# Patient Record
Sex: Male | Born: 1970 | Race: Black or African American | Hispanic: No | Marital: Married | Smoking: Never smoker
Health system: Southern US, Community
[De-identification: ages and names within clinical notes are randomized; demographics above are authoritative.]

## PROBLEM LIST (undated history)

## (undated) DIAGNOSIS — M5020 Other cervical disc displacement, unspecified cervical region: Secondary | ICD-10-CM

## (undated) DIAGNOSIS — M549 Dorsalgia, unspecified: Secondary | ICD-10-CM

## (undated) DIAGNOSIS — E119 Type 2 diabetes mellitus without complications: Secondary | ICD-10-CM

## (undated) DIAGNOSIS — F32A Depression, unspecified: Secondary | ICD-10-CM

---

## 1998-02-21 HISTORY — PX: KNEE SURGERY: SHX244

## 1999-01-01 ENCOUNTER — Encounter: Payer: Self-pay | Admitting: Emergency Medicine

## 1999-01-01 ENCOUNTER — Emergency Department (HOSPITAL_COMMUNITY): Admission: EM | Admit: 1999-01-01 | Discharge: 1999-01-01 | Payer: Self-pay | Admitting: Emergency Medicine

## 1999-01-18 ENCOUNTER — Emergency Department (HOSPITAL_COMMUNITY): Admission: EM | Admit: 1999-01-18 | Discharge: 1999-01-18 | Payer: Self-pay | Admitting: Emergency Medicine

## 1999-02-08 ENCOUNTER — Emergency Department (HOSPITAL_COMMUNITY): Admission: EM | Admit: 1999-02-08 | Discharge: 1999-02-08 | Payer: Self-pay | Admitting: Emergency Medicine

## 1999-02-10 ENCOUNTER — Emergency Department (HOSPITAL_COMMUNITY): Admission: EM | Admit: 1999-02-10 | Discharge: 1999-02-10 | Payer: Self-pay

## 1999-06-01 ENCOUNTER — Emergency Department (HOSPITAL_COMMUNITY): Admission: EM | Admit: 1999-06-01 | Discharge: 1999-06-01 | Payer: Self-pay | Admitting: Emergency Medicine

## 2000-03-21 ENCOUNTER — Emergency Department (HOSPITAL_COMMUNITY): Admission: EM | Admit: 2000-03-21 | Discharge: 2000-03-21 | Payer: Self-pay | Admitting: Emergency Medicine

## 2001-01-13 ENCOUNTER — Encounter: Payer: Self-pay | Admitting: Emergency Medicine

## 2001-01-13 ENCOUNTER — Emergency Department (HOSPITAL_COMMUNITY): Admission: EM | Admit: 2001-01-13 | Discharge: 2001-01-13 | Payer: Self-pay | Admitting: Emergency Medicine

## 2001-01-17 ENCOUNTER — Encounter: Payer: Self-pay | Admitting: Emergency Medicine

## 2001-01-17 ENCOUNTER — Emergency Department (HOSPITAL_COMMUNITY): Admission: EM | Admit: 2001-01-17 | Discharge: 2001-01-17 | Payer: Self-pay | Admitting: Emergency Medicine

## 2003-03-30 ENCOUNTER — Emergency Department (HOSPITAL_COMMUNITY): Admission: EM | Admit: 2003-03-30 | Discharge: 2003-03-31 | Payer: Self-pay | Admitting: Emergency Medicine

## 2003-06-27 ENCOUNTER — Emergency Department (HOSPITAL_COMMUNITY): Admission: EM | Admit: 2003-06-27 | Discharge: 2003-06-27 | Payer: Self-pay | Admitting: Emergency Medicine

## 2003-12-30 ENCOUNTER — Emergency Department (HOSPITAL_COMMUNITY): Admission: EM | Admit: 2003-12-30 | Discharge: 2003-12-30 | Payer: Self-pay | Admitting: Emergency Medicine

## 2004-03-22 ENCOUNTER — Inpatient Hospital Stay (HOSPITAL_COMMUNITY): Admission: EM | Admit: 2004-03-22 | Discharge: 2004-03-26 | Payer: Self-pay | Admitting: Emergency Medicine

## 2006-06-01 IMAGING — CR DG CHEST 2V
2 series · 2 of 2 positions shown · non-contrast
Comparison: 2 view chest x-ray 06/27/2003.

CLINICAL DATA: Cough, chest pain, shortness of breath.

CHEST - 2 VIEW  03/22/2004:

[view not recorded (1 of 2)]
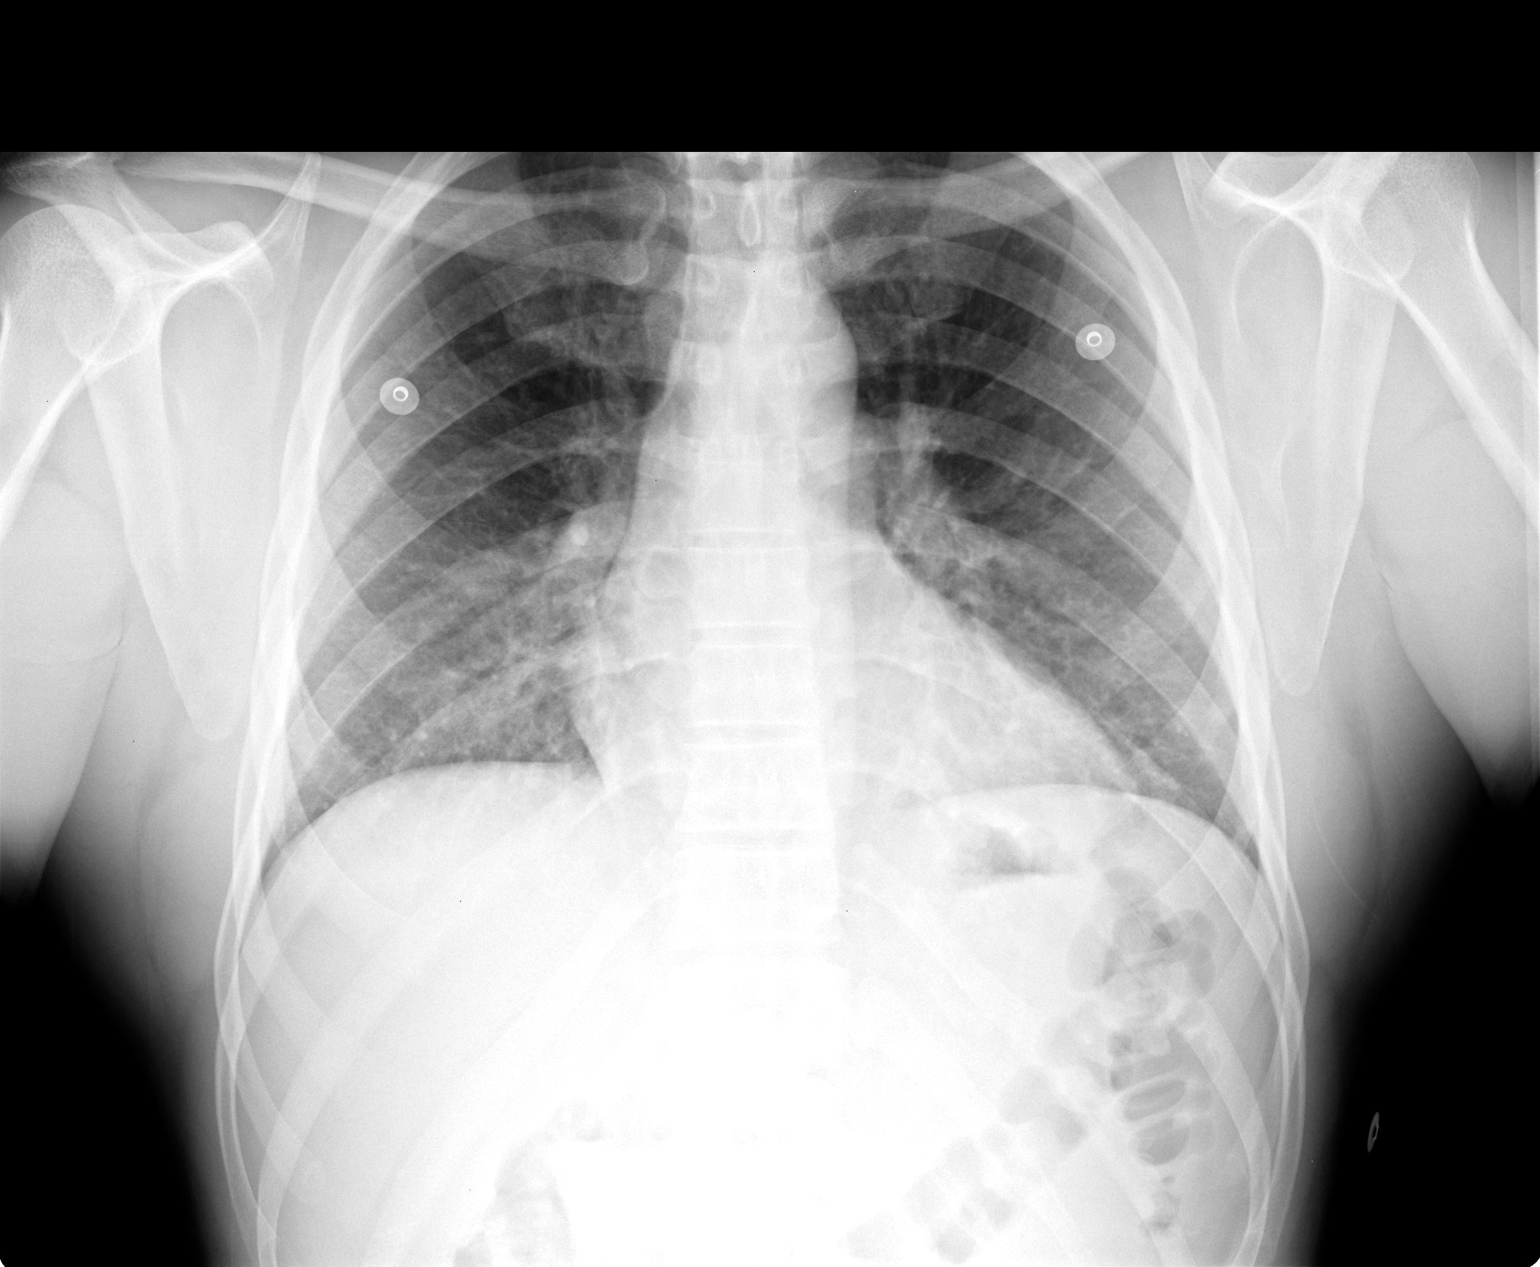

[view not recorded (2 of 2)]
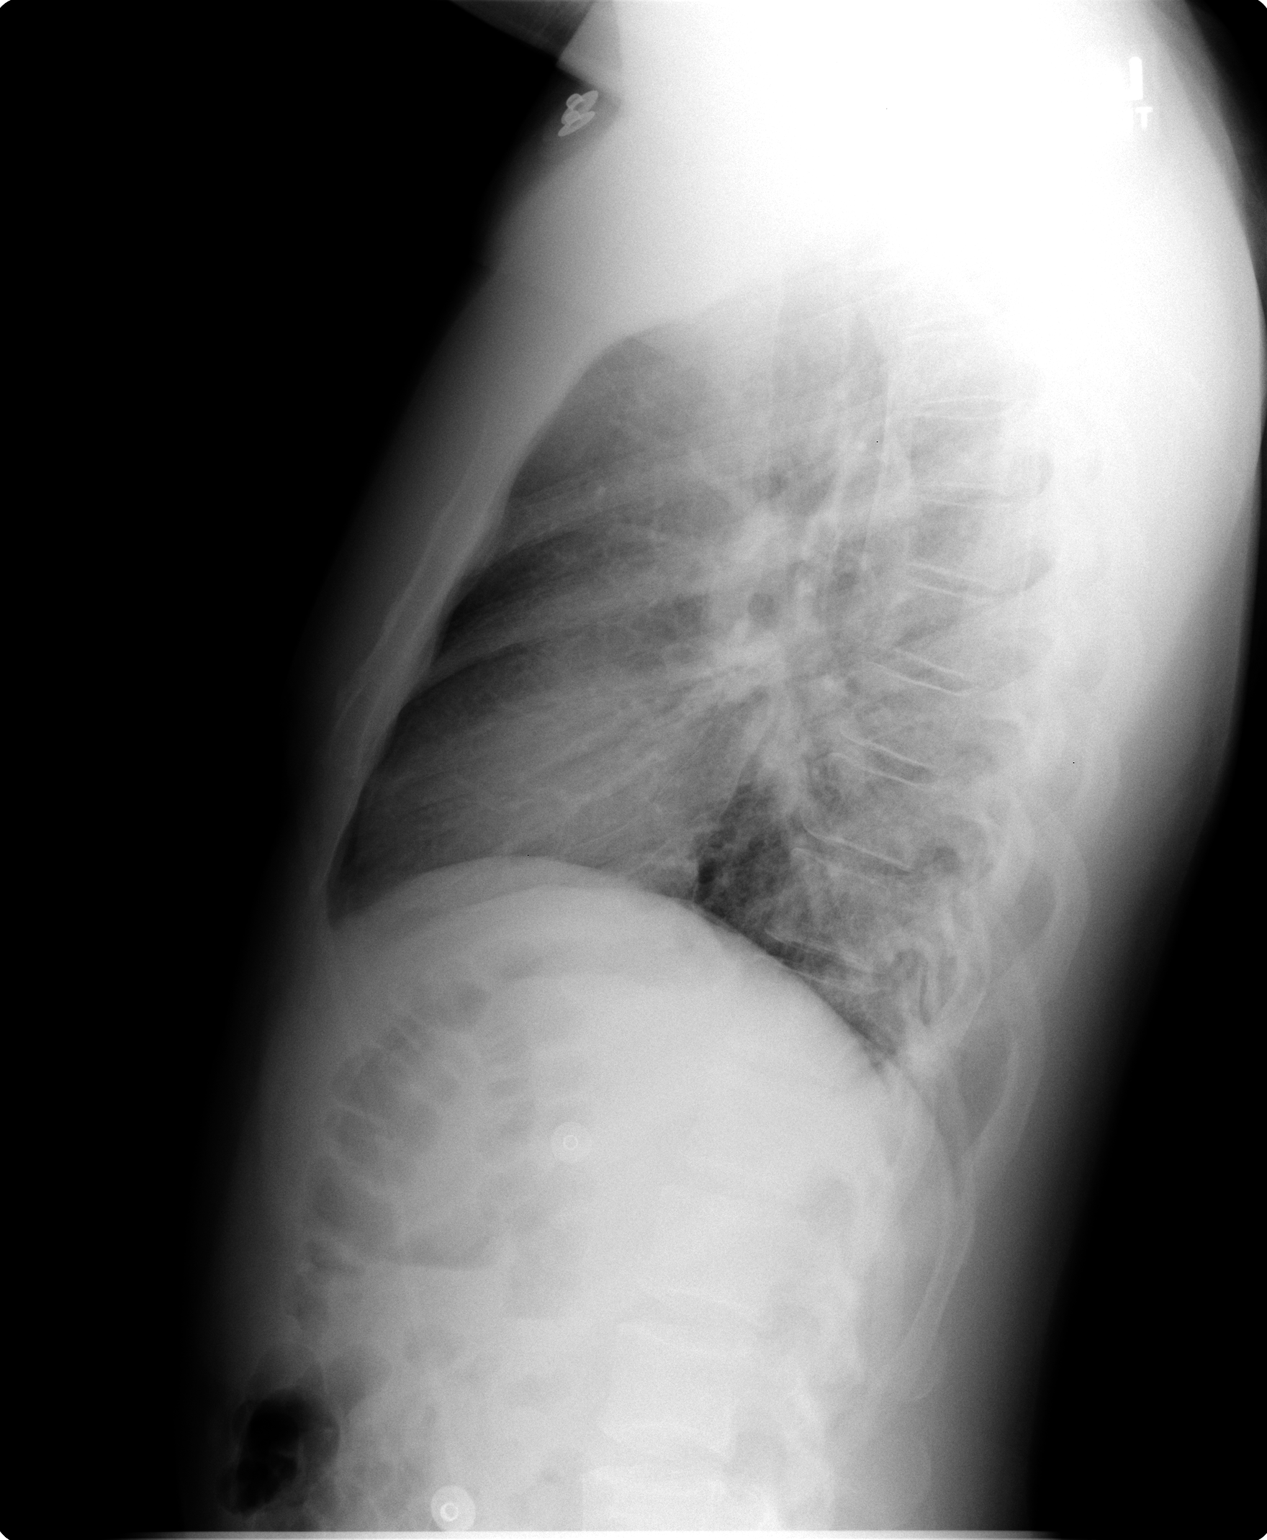

[2 of 2 positions shown; findings below may reference images not displayed]

FINDINGS: The cardiomediastinal silhouette is unremarkable. Patchy opacities
are present in the lower lobes bilaterally. The lungs appear clear otherwise.
There are no pleural effusions. The visualized bony thorax appears intact.
IMPRESSION: Bilateral lower lobe pneumonia.

## 2010-05-24 ENCOUNTER — Emergency Department (HOSPITAL_COMMUNITY)
Admission: EM | Admit: 2010-05-24 | Discharge: 2010-05-24 | Disposition: A | Discharge status: Home / Self Care | Payer: Self-pay | Attending: Emergency Medicine | Admitting: Emergency Medicine

## 2010-05-24 DIAGNOSIS — K0381 Cracked tooth: Secondary | ICD-10-CM | POA: Insufficient documentation

## 2010-05-24 DIAGNOSIS — K029 Dental caries, unspecified: Secondary | ICD-10-CM | POA: Insufficient documentation

## 2010-05-24 DIAGNOSIS — K089 Disorder of teeth and supporting structures, unspecified: Secondary | ICD-10-CM | POA: Insufficient documentation

## 2016-08-10 ENCOUNTER — Encounter (HOSPITAL_COMMUNITY): Payer: Self-pay | Admitting: Emergency Medicine

## 2016-08-10 ENCOUNTER — Emergency Department (HOSPITAL_COMMUNITY)
Admission: EM | Admit: 2016-08-10 | Discharge: 2016-08-10 | Disposition: A | Discharge status: Home / Self Care | Payer: Self-pay | Attending: Emergency Medicine | Admitting: Emergency Medicine

## 2016-08-10 DIAGNOSIS — B9789 Other viral agents as the cause of diseases classified elsewhere: Secondary | ICD-10-CM

## 2016-08-10 DIAGNOSIS — J069 Acute upper respiratory infection, unspecified: Secondary | ICD-10-CM

## 2016-08-10 DIAGNOSIS — E119 Type 2 diabetes mellitus without complications: Secondary | ICD-10-CM | POA: Insufficient documentation

## 2016-08-10 DIAGNOSIS — J302 Other seasonal allergic rhinitis: Secondary | ICD-10-CM | POA: Insufficient documentation

## 2016-08-10 DIAGNOSIS — R05 Cough: Secondary | ICD-10-CM | POA: Insufficient documentation

## 2016-08-10 DIAGNOSIS — J3089 Other allergic rhinitis: Secondary | ICD-10-CM

## 2016-08-10 HISTORY — DX: Type 2 diabetes mellitus without complications: E11.9

## 2016-08-10 HISTORY — DX: Dorsalgia, unspecified: M54.9

## 2016-08-10 HISTORY — DX: Other cervical disc displacement, unspecified cervical region: M50.20

## 2016-08-10 LAB — RAPID STREP SCREEN (MED CTR MEBANE ONLY): Streptococcus, Group A Screen (Direct): NEGATIVE

## 2016-08-10 MED ORDER — ALBUTEROL SULFATE HFA 108 (90 BASE) MCG/ACT IN AERS
2.0000 | INHALATION_SPRAY | Freq: Once | RESPIRATORY_TRACT | Status: AC
  Administered 2016-08-10: 2 via RESPIRATORY_TRACT
  Filled 2016-08-10: qty 6.7

## 2016-08-10 MED ORDER — NAPROXEN 250 MG PO TABS: 250.0000 mg | ORAL_TABLET | Freq: Two times a day (BID) | ORAL | 0 refills | Status: DC

## 2016-08-10 MED ORDER — BENZONATATE 100 MG PO CAPS: 100.0000 mg | ORAL_CAPSULE | Freq: Three times a day (TID) | ORAL | 0 refills | Status: DC | PRN

## 2016-08-10 MED ORDER — CETIRIZINE HCL 10 MG PO TABS: 10.0000 mg | ORAL_TABLET | Freq: Every day | ORAL | 1 refills | Status: DC

## 2016-08-10 MED ORDER — FLUTICASONE PROPIONATE 50 MCG/ACT NA SUSP: 2.0000 | Freq: Every day | NASAL | 0 refills | Status: DC

## 2016-08-10 NOTE — ED Provider Notes (Signed)
[facility]-EMERGENCY DEPT Provider Note   CSN: [id number] Arrival date & time: 08/10/16  2016     History   Chief Complaint Chief Complaint  Patient presents with  . Sore Throat  . Cough    HPI Brandon Boyer is a 46 y.o. male.  Brandon Boyer is a 46 y.o. Male who presents to the emergency department complaining of one day of sneezing, nasal congestion, postnasal drip, sore throat and coughing. He reports he thinks he became sick from a coworker. He denies any fevers. No treatments attempted prior to arrival. He also reports he feels some chest tightness, denies wheezing. He denies fevers, chest pain, shortness of breath, abdominal pain, rashes or wheezing.   The history is provided by the patient and medical records. No language interpreter was used.  Sore Throat  Pertinent negatives include no chest pain, no abdominal pain, no headaches and no shortness of breath.  Cough  Associated symptoms include rhinorrhea and sore throat. Pertinent negatives include no chest pain, no chills, no headaches, no myalgias, no shortness of breath and no wheezing.    Past Medical History:  Diagnosis Date  . Back pain   . Diabetes mellitus without complication (HCC)    Type II  . Herniated Disc     There are no active problems to display for this patient.   Past Surgical History:  Procedure Laterality Date  . KNEE SURGERY Right 2000       Home Medications    Prior to Admission medications   Medication Sig Start Date End Date Taking? Authorizing Provider  benzonatate (TESSALON) 100 MG capsule Take 1 capsule (100 mg total) by mouth 3 (three) times daily as needed for cough. 08/10/16   Everlene Farrier, PA-C  cetirizine (ZYRTEC ALLERGY) 10 MG tablet Take 1 tablet (10 mg total) by mouth daily. 08/10/16   Everlene Farrier, PA-C  fluticasone (FLONASE) 50 MCG/ACT nasal spray Place 2 sprays into both nostrils daily. 08/10/16   Everlene Farrier, PA-C  naproxen (NAPROSYN) 250 MG tablet Take 1  tablet (250 mg total) by mouth 2 (two) times daily with a meal. 08/10/16   Everlene Farrier, PA-C    Family History No family history on file.  Social History Social History  Substance Use Topics  . Smoking status: Never Smoker  . Smokeless tobacco: Never Used  . Alcohol use No     Allergies   Patient has no known allergies.   Review of Systems Review of Systems  Constitutional: Negative for chills and fever.  HENT: Positive for congestion, postnasal drip, rhinorrhea, sneezing and sore throat. Negative for ear discharge, trouble swallowing and voice change.   Eyes: Negative for visual disturbance.  Respiratory: Positive for cough and chest tightness. Negative for shortness of breath and wheezing.   Cardiovascular: Negative for chest pain.  Gastrointestinal: Negative for abdominal pain, nausea and vomiting.  Musculoskeletal: Negative for myalgias and neck pain.  Skin: Negative for rash.  Neurological: Negative for light-headedness and headaches.     Physical Exam Updated Vital Signs BP (!) 143/80 (BP Location: Left Arm)   Pulse 88   Temp 98.9 F (37.2 C) (Oral)   Resp 18   Ht 5\' 6"  (1.676 m)   Wt 83.9 kg (185 lb)   SpO2 98%   BMI 29.86 kg/m   Physical Exam  Constitutional: He appears well-developed and well-nourished. No distress.  Nontoxic appearing.  HENT:  Head: Normocephalic and atraumatic.  Right Ear: External ear normal.  Left Ear: External ear normal.  Mouth/Throat: Oropharynx is clear and moist. No oropharyngeal exudate.  Mild clear middle ear effusion noted bilaterally. No TM erythema or loss of landmarks. Boggy nasal turbinates and rhinorrhea present bilaterally. Throat is clear.  Eyes: Conjunctivae are normal. Pupils are equal, round, and reactive to light. Right eye exhibits no discharge. Left eye exhibits no discharge.  Neck: Normal range of motion. Neck supple. No JVD present. No tracheal deviation present.  Cardiovascular: Normal rate, regular  rhythm, normal heart sounds and intact distal pulses.   Pulmonary/Chest: Effort normal and breath sounds normal. No stridor. No respiratory distress. He has no wheezes. He has no rales.  Lungs are clear to ascultation bilaterally. Symmetric chest expansion bilaterally. No increased work of breathing. No rales or rhonchi.    Abdominal: Soft. There is no tenderness.  Musculoskeletal: He exhibits no edema.  Lymphadenopathy:    He has no cervical adenopathy.  Neurological: He is alert. Coordination normal.  Skin: Skin is warm and dry. No rash noted. He is not diaphoretic. No erythema. No pallor.  Psychiatric: He has a normal mood and affect. His behavior is normal.  Nursing note and vitals reviewed.    ED Treatments / Results  Labs (all labs ordered are listed, but only abnormal results are displayed) Labs Reviewed  RAPID STRE[facility]CREEN ([facility])  CULTURE, GROUP A STREP [facility])    EKG  EKG Interpretation None       Radiology No results found.  Procedures Procedures (including critical care time)  Medications Ordered in ED Medications  albuterol (PROVENTIL HFA;VENTOLIN HFA) 108 (90 Base) MCG/ACT inhaler 2 puff (not administered)     Initial Impression / Assessment and Plan / ED Course  I have reviewed the triage vital signs and the nursing notes.  Pertinent labs & imaging results that were available during my care of the patient were reviewed by me and considered in my medical decision making (see chart for details).    This  is a 46 y.o. Male who presents to the emergency department complaining of one day of sneezing, nasal congestion, postnasal drip, sore throat and coughing. He reports he thinks he became sick from a coworker. He denies any fevers. No treatments attempted prior to arrival. He also reports he feels some chest tightness, denies wheezing. He is not a smoker.  On exam the patient is afebrile nontoxic appearing. His lungs are clear to auscultation  bilaterally. No wheezing. Patient does complain of some chest tightness, however no wheezing noted on exam. Will provide with albuterol inhaler due to his subjective chest tightness. He has evidence of postnasal drip, rhinorrhea and clear middle ear effusion bilaterally. Patient has upper respiratory infection causing cough. Will discharge with Flonase, Zyrtec, Tessalon Perles and naproxen. I discussed strict and specific return precautions. I advised the patient to follow-up with their primary care provider this week. I advised the patient to return to the emergency department with new or worsening symptoms or new concerns. The patient verbalized understanding and agreement with plan.      Final Clinical Impressions(s) / ED Diagnoses   Final diagnoses:  Viral URI with cough  Seasonal allergic rhinitis due to other allergic trigger    New Prescriptions New Prescriptions   BENZONATATE (TESSALON) 100 MG CAPSULE    Take 1 capsule (100 mg total) by mouth 3 (three) times daily as needed for cough.   CETIRIZINE (ZYRTEC ALLERGY) 10 MG TABLET    Take 1 tablet (10 mg total) by mouth daily.   FLUTICASONE (  FLONASE) 50 MCG/ACT NASAL SPRAY    Place 2 sprays into both nostrils daily.   NAPROXEN (NAPROSYN) 250 MG TABLET    Take 1 tablet (250 mg total) by mouth 2 (two) times daily with a meal.     Everlene Farrier, PA-C 08/10/16 2346    Dione Booze, MD 08/11/16 [phone]

## 2016-08-10 NOTE — ED Triage Notes (Signed)
Pt comes in with complaints of sore throat and congested cough that he thinks he got from a co-worker.  States his back and chest have been hurting from coughing. States he is not sure if he has had a fever or not.  Afebrile on assessment.  Airway intact.  In no acute distress.

## 2016-08-10 NOTE — ED Notes (Signed)
Pt ambulatory and independent at discharge.  Verbalized understanding of discharge instructions 

## 2016-08-13 LAB — CULTURE, GROUP A STREP (THRC)

## 2017-08-28 ENCOUNTER — Emergency Department (HOSPITAL_COMMUNITY): Payer: 59

## 2017-08-28 ENCOUNTER — Encounter (HOSPITAL_COMMUNITY): Payer: Self-pay

## 2017-08-28 ENCOUNTER — Emergency Department (HOSPITAL_COMMUNITY)
Admission: EM | Admit: 2017-08-28 | Discharge: 2017-08-28 | Disposition: A | Discharge status: Home / Self Care | Payer: 59 | Attending: Emergency Medicine | Admitting: Emergency Medicine

## 2017-08-28 ENCOUNTER — Other Ambulatory Visit: Payer: Self-pay

## 2017-08-28 DIAGNOSIS — M7552 Bursitis of left shoulder: Secondary | ICD-10-CM | POA: Insufficient documentation

## 2017-08-28 DIAGNOSIS — Z7984 Long term (current) use of oral hypoglycemic drugs: Secondary | ICD-10-CM | POA: Insufficient documentation

## 2017-08-28 DIAGNOSIS — M25512 Pain in left shoulder: Secondary | ICD-10-CM | POA: Diagnosis present

## 2017-08-28 DIAGNOSIS — R0602 Shortness of breath: Secondary | ICD-10-CM | POA: Insufficient documentation

## 2017-08-28 DIAGNOSIS — R079 Chest pain, unspecified: Secondary | ICD-10-CM | POA: Insufficient documentation

## 2017-08-28 DIAGNOSIS — E119 Type 2 diabetes mellitus without complications: Secondary | ICD-10-CM | POA: Diagnosis not present

## 2017-08-28 DIAGNOSIS — Z79899 Other long term (current) drug therapy: Secondary | ICD-10-CM | POA: Insufficient documentation

## 2017-08-28 LAB — CBC
HCT: 50.7 % (ref 39.0–52.0)
Hemoglobin: 17.2 g/dL — ABNORMAL HIGH (ref 13.0–17.0)
MCH: 30.6 pg (ref 26.0–34.0)
MCHC: 33.9 g/dL (ref 30.0–36.0)
MCV: 90.2 fL (ref 78.0–100.0)
Platelets: 204 10*3/uL (ref 150–400)
RBC: 5.62 MIL/uL (ref 4.22–5.81)
RDW: 12.8 % (ref 11.5–15.5)
WBC: 4.6 10*3/uL (ref 4.0–10.5)

## 2017-08-28 LAB — BASIC METABOLIC PANEL
Anion gap: 8 (ref 5–15)
BUN: 14 mg/dL (ref 6–20)
CO2: 27 mmol/L (ref 22–32)
Calcium: 9.9 mg/dL (ref 8.9–10.3)
Chloride: 101 mmol/L (ref 98–111)
Creatinine, Ser: 1.11 mg/dL (ref 0.61–1.24)
GFR calc Af Amer: >60 mL/min (ref >60)
GFR calc non Af Amer: >60 mL/min (ref >60)
Glucose, Bld: 288 mg/dL — ABNORMAL HIGH (ref 70–99)
Potassium: 4.1 mmol/L (ref 3.5–5.1)
Sodium: 136 mmol/L (ref 135–145)

## 2017-08-28 LAB — I-STAT TROPONIN, ED: Troponin i, poc: 0.01 ng/mL (ref 0.00–0.08)

## 2017-08-28 MED ORDER — DEXAMETHASONE 4 MG PO TABS: 4.0000 mg | ORAL_TABLET | Freq: Two times a day (BID) | ORAL | 0 refills | Status: DC

## 2017-08-28 NOTE — ED Triage Notes (Signed)
Patient c/o left arm pain x 4 weeks Patient states when he woke this AM he had SOB and mid chest pain.

## 2017-08-28 NOTE — ED Notes (Signed)
Discharge instructions reviewed with patient. Patient verbalizes understanding. VSS.   

## 2017-09-01 NOTE — ED Provider Notes (Signed)
[facility]-EMERGENCY DEPT Provider Note   CSN: [id number] Arrival date & time: 08/28/17  1121     History   Chief Complaint Chief Complaint  Patient presents with  . Arm Pain  . Chest Pain  . Shortness of Breath    HPI Brandon Boyer is a 47 y.o. male.  HPI   46yM with L shoulder pain for months. Worse this morning when getting out of bed and also felt SOB. His breath felt beter as he got up and walked around. After about 5 minutes it had completely resolved and has nor recurred. No cough. No fever. No unusual leg pain or swelling. [facility]. Pain worse with certain movements particularly when raising over the head.   Past Medical History:  Diagnosis Date  . Back pain   . Diabetes mellitus without complication (HCC)    Type II  . Herniated Disc     There are no active problems to display for this patient.   Past Surgical History:  Procedure Laterality Date  . KNEE SURGERY Right 2000        Home Medications    Prior to Admission medications   Medication Sig Start Date End Date Taking? Authorizing Provider  diclofenac (VOLTAREN) 75 MG EC tablet Take 75 mg by mouth 2 (two) times daily.   Yes [provider]  HYDROcodone-acetaminophen (NORCO/VICODIN) 5-325 MG tablet Take 1 tablet by mouth every 6 (six) hours as needed for moderate pain.   Yes [provider]  meloxicam (MOBIC) 15 MG tablet Take 15 mg by mouth 2 (two) times daily as needed for pain.   Yes [provider]  metFORMIN (GLUCOPHAGE) 500 MG tablet Take 500 mg by mouth 2 (two) times daily with a meal.    Yes [provider]  methocarbamol (ROBAXIN) 500 MG tablet Take 500 mg by mouth 4 (four) times daily.   Yes [provider]  predniSONE (DELTASONE) 5 MG tablet Take 5 mg by mouth 3 (three) times daily.   Yes [provider]  dexamethasone (DECADRON) 4 MG tablet Take 1 tablet (4 mg total) by  mouth 2 (two) times daily with a meal. 08/28/17   Raeford RazorKohut, Kareen Hitsman, MD    Family History Family History  Problem Relation Age of Onset  . Sickle cell anemia Mother   . Heart failure Father     Social History Social History   Tobacco Use  . Smoking status: Never Smoker  . Smokeless tobacco: Never Used  Substance Use Topics  . Alcohol use: No  . Drug use: No     Allergies   Pork-derived products   Review of Systems Review of Systems  All systems reviewed and negative, other than as noted in HPI.  Physical Exam Updated Vital Signs BP 116/76 (BP Location: Right Arm)   Pulse 73   Temp 98.1 F (36.7 C) (Oral)   Resp 18   Ht 5\' 6"  (1.676 m)   Wt 79.4 kg (175 lb)   SpO2 100%   BMI 28.25 kg/m   Physical Exam  Constitutional: He appears well-developed and well-nourished. No distress.  HENT:  Head: Normocephalic and atraumatic.  Eyes: Conjunctivae are normal. Right eye exhibits no discharge. Left eye exhibits no discharge.  Neck: Neck supple.  Cardiovascular: Normal rate, regular rhythm and normal heart sounds. Exam reveals no gallop and no friction rub.  No murmur heard. Pulmonary/Chest: Effort normal and breath sounds normal. No respiratory distress.  Abdominal: Soft. He exhibits no distension. There is no tenderness.  Musculoskeletal: He exhibits no edema or tenderness.  Point tenderness just below L acromion. + kennedy-hawkins test. NVI.   Neurological: He is alert.  Skin: Skin is warm and dry.  Psychiatric: He has a normal mood and affect. His behavior is normal. Thought content normal.  Nursing note and vitals reviewed.    ED Treatments / Results  Labs (all labs ordered are listed, but only abnormal results are displayed) Labs Reviewed  BASIC METABOLIC PANEL - Abnormal; Notable for the following components:      Result Value   Glucose, Bld 288 (*)    All other components within normal limits  CBC - Abnormal; Notable for the following components:    Hemoglobin 17.2 (*)    All other components within normal limits  I-STAT TROPONIN, ED    EKG EKG Interpretation  Date/Time:  Monday August 28 2017 11:27:41 EDT Ventricular Rate:  78 PR Interval:    QRS Duration: 89 QT Interval:  343 QTC Calculation: 391 R Axis:   73 Text Interpretation:  Sinus rhythm Nonspecific repol abnormality, inferior and precordial  leads Confirmed by Raeford Razor [phone]) on 08/28/2017 1:13:09 PM   Radiology No results found.  Procedures Procedures (including critical care time)  Medications Ordered in ED Medications - No data to display   Initial Impression / Assessment and Plan / ED Course  I have reviewed the triage vital signs and the nursing notes.  Pertinent labs & imaging results that were available during my care of the patient were reviewed by me and considered in my medical decision making (see chart for details).     46yM with L shoulder pain for months. Like impingement syndrome. PRN NSAIDs. Sling for comfort. Avoid activities that exacerbate. Ortho FU. I'm not sure of exact etiology of dyspnea. Improved with activity. Now symptom free in this regard. No particularly concerning associated features. Normal pulmonary exam.   Final Clinical Impressions(s) / ED Diagnoses   Final diagnoses:  Subacromial bursitis of left shoulder joint    ED Discharge Orders        Ordered    dexamethasone (DECADRON) 4 MG tablet  2 times daily with meals     08/28/17 1406       Raeford Razor, MD 09/01/17 [phone]

## 2019-01-31 ENCOUNTER — Emergency Department (HOSPITAL_COMMUNITY): Payer: 59

## 2019-01-31 ENCOUNTER — Emergency Department (HOSPITAL_COMMUNITY)
Admission: EM | Admit: 2019-01-31 | Discharge: 2019-01-31 | Disposition: A | Discharge status: Home / Self Care | Payer: 59 | Attending: Emergency Medicine | Admitting: Emergency Medicine

## 2019-01-31 ENCOUNTER — Other Ambulatory Visit: Payer: Self-pay

## 2019-01-31 DIAGNOSIS — E119 Type 2 diabetes mellitus without complications: Secondary | ICD-10-CM | POA: Diagnosis not present

## 2019-01-31 DIAGNOSIS — R5383 Other fatigue: Secondary | ICD-10-CM | POA: Insufficient documentation

## 2019-01-31 DIAGNOSIS — Z79899 Other long term (current) drug therapy: Secondary | ICD-10-CM | POA: Diagnosis not present

## 2019-01-31 DIAGNOSIS — M7918 Myalgia, other site: Secondary | ICD-10-CM | POA: Diagnosis not present

## 2019-01-31 DIAGNOSIS — U071 COVID-19: Secondary | ICD-10-CM | POA: Insufficient documentation

## 2019-01-31 DIAGNOSIS — R0981 Nasal congestion: Secondary | ICD-10-CM | POA: Diagnosis not present

## 2019-01-31 DIAGNOSIS — Z7984 Long term (current) use of oral hypoglycemic drugs: Secondary | ICD-10-CM | POA: Diagnosis not present

## 2019-01-31 DIAGNOSIS — R05 Cough: Secondary | ICD-10-CM | POA: Diagnosis present

## 2019-01-31 LAB — POC SARS CORONAVIRUS 2 AG -  ED: SARS Coronavirus 2 Ag: POSITIVE — AB

## 2019-01-31 MED ORDER — MELOXICAM 7.5 MG PO TABS
15.0000 mg | ORAL_TABLET | Freq: Once | ORAL | Status: AC
  Administered 2019-01-31: 09:00:00 15 mg via ORAL
  Filled 2019-01-31: qty 2

## 2019-01-31 NOTE — Discharge Instructions (Signed)
Please go to primary doctor to schedule virtual recheck for 2 3 days from now.  Recommend Tylenol and your previously prescribed meloxicam for muscle aches.  If you develop difficulty breathing, vomiting, chest pain or other new concerning symptom, recommend return to ER for reassessment.  Please follow isolation precautions as discussed.  Please see attached for CDC guidelines.

## 2019-01-31 NOTE — ED Triage Notes (Signed)
Pt endorses coughing, sneezing, bodyaches, chills x 5 days. Has NOT lost taste or smell. Last took tylenol at midnight last night. Denies shob or CP. VSS.

## 2019-01-31 NOTE — ED Provider Notes (Signed)
[facility] Provider Note   CSN: [id number] Arrival date & time: 01/31/19  [id number]     History Chief Complaint  Patient presents with  . Cough  . Nasal Congestion    Brandon Boyer is a 48 y.o. male.  Presents to ER with cough, congestion, body aches and chills for the last 5 days.  Symptoms started Saturday evening, noted generalized fatigue, woke up in the next morning with mild cough.  Also noted beginnings of myalgias.  States all the symptoms have steadily worsened, today cough is moderate, nonproductive, nonbloody.  Has not had any associated shortness of breath or chest pain.  No abdominal pain or nausea.  States his muscles hurt "all over", has had no intermittent headache, not worst headache of his life, not sudden onset.  States his boss at work was having symptoms earlier on Saturday but he is unsure if he tested positive for the coronavirus.  States his children have also developed symptoms for the last couple days similar to his, they are getting tested today for COVID-19.  Has been able to eat and drink.  No loss of taste or smell.  Patient reports only medical problem is diabetes, Metformin and Lantus, noted sugars running mildly higher than normal but only up to around 200.  HPI     Past Medical History:  Diagnosis Date  . Back pain   . Diabetes mellitus without complication (HCC)    Type II  . Herniated Disc     There are no problems to display for this patient.   Past Surgical History:  Procedure Laterality Date  . KNEE SURGERY Right 2000       Family History  Problem Relation Age of Onset  . Sickle cell anemia Mother   . Heart failure Father     Social History   Tobacco Use  . Smoking status: Never Smoker  . Smokeless tobacco: Never Used  Substance Use Topics  . Alcohol use: No  . Drug use: No    Home Medications Prior to Admission medications   Medication Sig Start Date End Date Taking? Authorizing  Provider  dexamethasone (DECADRON) 4 MG tablet Take 1 tablet (4 mg total) by mouth 2 (two) times daily with a meal. 08/28/17   Raeford RazorKohut, Stephen, MD  diclofenac (VOLTAREN) 75 MG EC tablet Take 75 mg by mouth 2 (two) times daily.    [provider]  HYDROcodone-acetaminophen (NORCO/VICODIN) 5-325 MG tablet Take 1 tablet by mouth every 6 (six) hours as needed for moderate pain.    [provider]  meloxicam (MOBIC) 15 MG tablet Take 15 mg by mouth 2 (two) times daily as needed for pain.    [provider]  metFORMIN (GLUCOPHAGE) 500 MG tablet Take 500 mg by mouth 2 (two) times daily with a meal.     [provider]  methocarbamol (ROBAXIN) 500 MG tablet Take 500 mg by mouth 4 (four) times daily.    [provider]  predniSONE (DELTASONE) 5 MG tablet Take 5 mg by mouth 3 (three) times daily.    [provider]    Allergies    Pork-derived products  Review of Systems   Review of Systems  Constitutional: Positive for chills and fatigue. Negative for fever.  HENT: Negative for ear pain and sore throat.   Eyes: Negative for pain and visual disturbance.  Respiratory: Positive for cough. Negative for shortness of breath.   Cardiovascular: Negative for chest pain and palpitations.  Gastrointestinal: Negative for abdominal pain and vomiting.  Genitourinary: Negative for dysuria and hematuria.  Musculoskeletal: Positive for myalgias. Negative for arthralgias and back pain.  Skin: Negative for color change and rash.  Neurological: Negative for seizures and syncope.  All other systems reviewed and are negative.   Physical Exam Updated Vital Signs BP 114/84   Pulse 88   Temp 98.5 F (36.9 C) (Oral)   Resp 16   SpO2 95%   Physical Exam Vitals and nursing note reviewed.  Constitutional:      Appearance: He is well-developed.  HENT:     Head: Normocephalic and atraumatic.     Mouth/Throat:     Mouth: Mucous membranes are moist.     Pharynx:  Oropharynx is clear.  Eyes:     Conjunctiva/sclera: Conjunctivae normal.  Cardiovascular:     Rate and Rhythm: Normal rate and regular rhythm.     Heart sounds: No murmur.  Pulmonary:     Effort: Pulmonary effort is normal. No respiratory distress.     Breath sounds: Normal breath sounds.  Abdominal:     Palpations: Abdomen is soft.     Tenderness: There is no abdominal tenderness.  Musculoskeletal:        General: No swelling or deformity.     Cervical back: Neck supple.     Right lower leg: No edema.     Left lower leg: No edema.  Skin:    General: Skin is warm and dry.  Neurological:     Mental Status: He is alert.     ED Results / Procedures / Treatments   Labs (all labs ordered are listed, but only abnormal results are displayed) Labs Reviewed  POC SARS CORONAVIRUS 2 AG -  ED    EKG None  Radiology No results found.  Procedures Procedures (including critical care time)  Medications Ordered in ED Medications - No data to display  ED Course  I have reviewed the triage vital signs and the nursing notes.  Pertinent labs & imaging results that were available during my care of the patient were reviewed by me and considered in my medical decision making (see chart for details).  Clinical Course as of Jan 30 902  Thu Jan 31, 2019  6283 SARS Coronavirus 2 Ag(!): POSITIVE [RD]  0901 Updated patient on results, reviewed return precautions, isolation precautions   [RD]    Clinical Course User Index [RD] Milagros Loll, MD   MDM Rules/Calculators/A&P                   48 year old male past medical history diabetes presents to ER with cough, congestion, myalgias.  On exam patient is noted to be very well-appearing, normal vital signs, no respiratory difficulty. Tolerating PO.  Suspect acute viral illness.  CXR without evidence for pneumonia.  Rapid Covid positive.  Reviewed isolation precautions, return precautions in detail with patient.  Recommended virtual  recheck with PCP.    After the discussed management above, the patient was determined to be safe for discharge.  The patient was in agreement with this plan and all questions regarding their care were answered.  ED return precautions were discussed and the patient will return to the ED with any significant worsening of condition.    Brandon Boyer was evaluated in Emergency Department on 01/31/2019 for the symptoms described in the history of present illness. He was evaluated in the context of the global COVID-19 pandemic, which necessitated consideration that the patient might  be at risk for infection with the SARS-CoV-2 virus that causes COVID-19. Institutional protocols and algorithms that pertain to the evaluation of patients at risk for COVID-19 are in a state of rapid change based on information released by regulatory bodies including the CDC and federal and state organizations. These policies and algorithms were followed during the patient's care in the ED.    Final Clinical Impression(s) / ED Diagnoses Final diagnoses:  [id number]    Rx / DC Orders ED Discharge Orders    None       Lucrezia Starch, MD 01/31/19 [phone]

## 2019-11-07 IMAGING — CR DG CHEST 2V
2 series · 2 of 2 positions shown · non-contrast
Comparison: Radiographs 03/24/2004 and 03/22/2004.

CLINICAL DATA: Shoulder pain for weeks ago. Chest pain and
shortness of breath.

EXAM:
CHEST - 2 VIEW

[w chest pa]
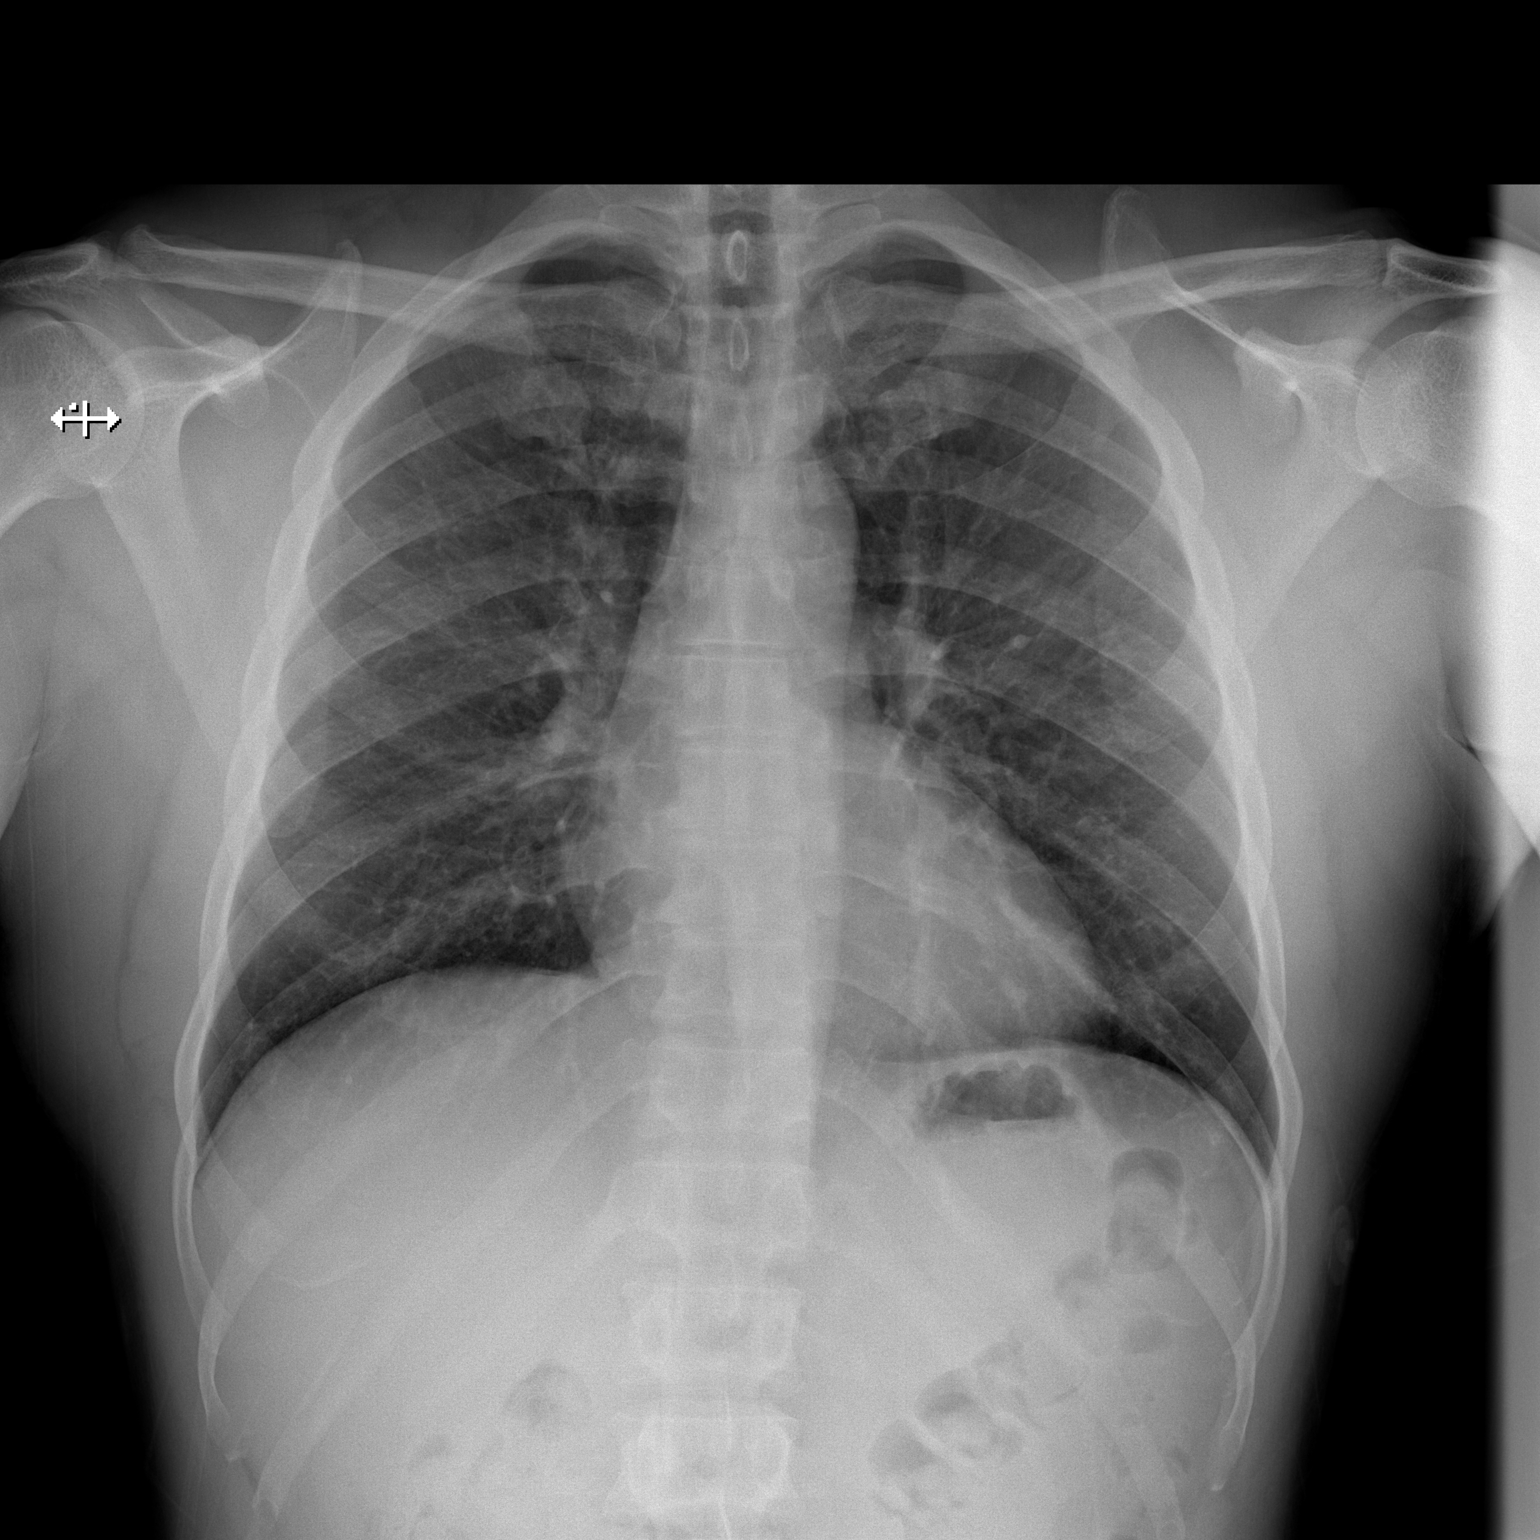

[w chest lat]
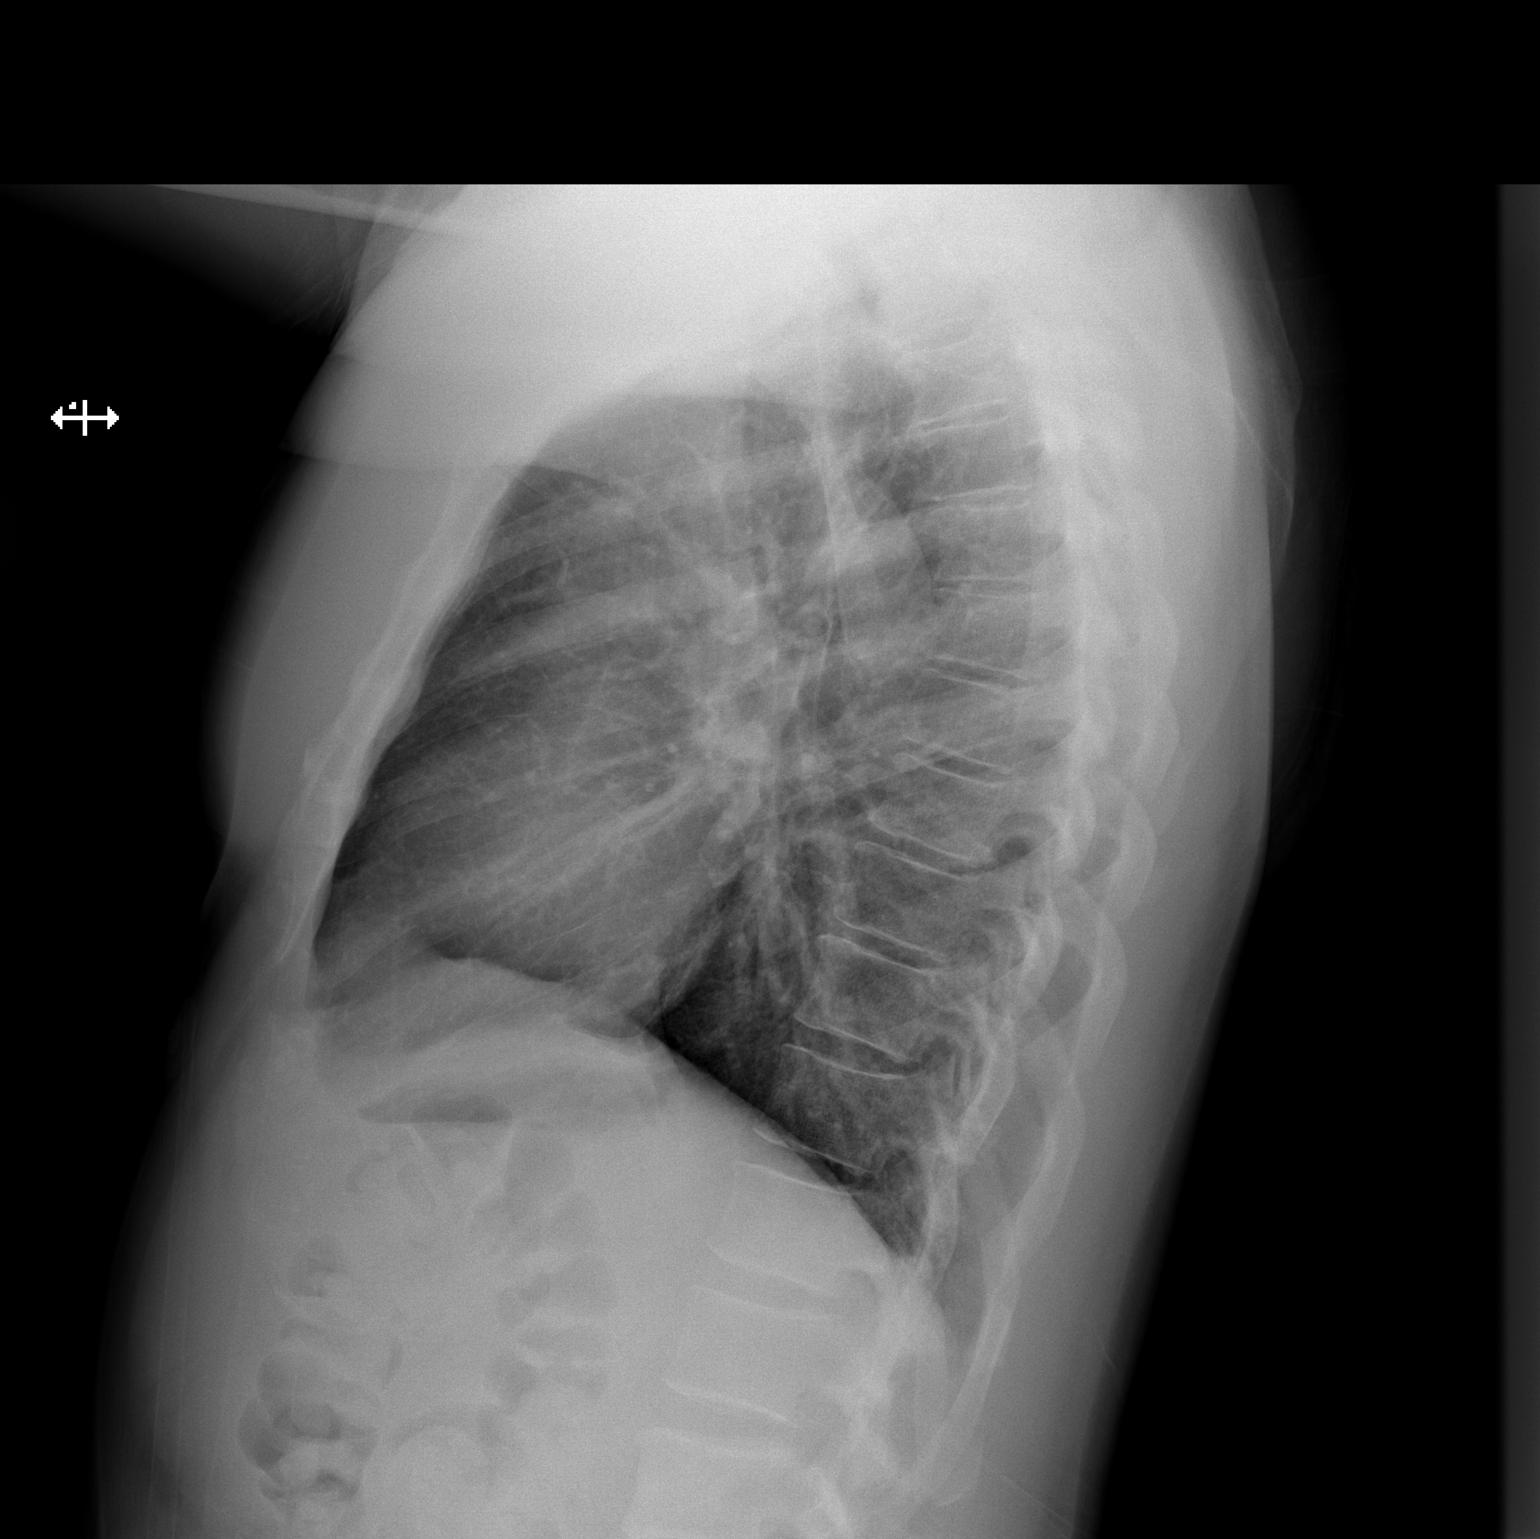

[2 of 2 positions shown; findings below may reference images not displayed]

FINDINGS: The heart size and mediastinal contours are normal. The lungs are
clear. There is no pleural effusion or pneumothorax. No acute
osseous findings are identified.
IMPRESSION: No active cardiopulmonary process.

## 2021-04-11 IMAGING — DX DG CHEST 1V PORT
1 series · 1 of 1 positions shown · non-contrast
Comparison: August 28, 2017

CLINICAL DATA: Cough and fever

EXAM:
PORTABLE CHEST 1 VIEW

[chest]
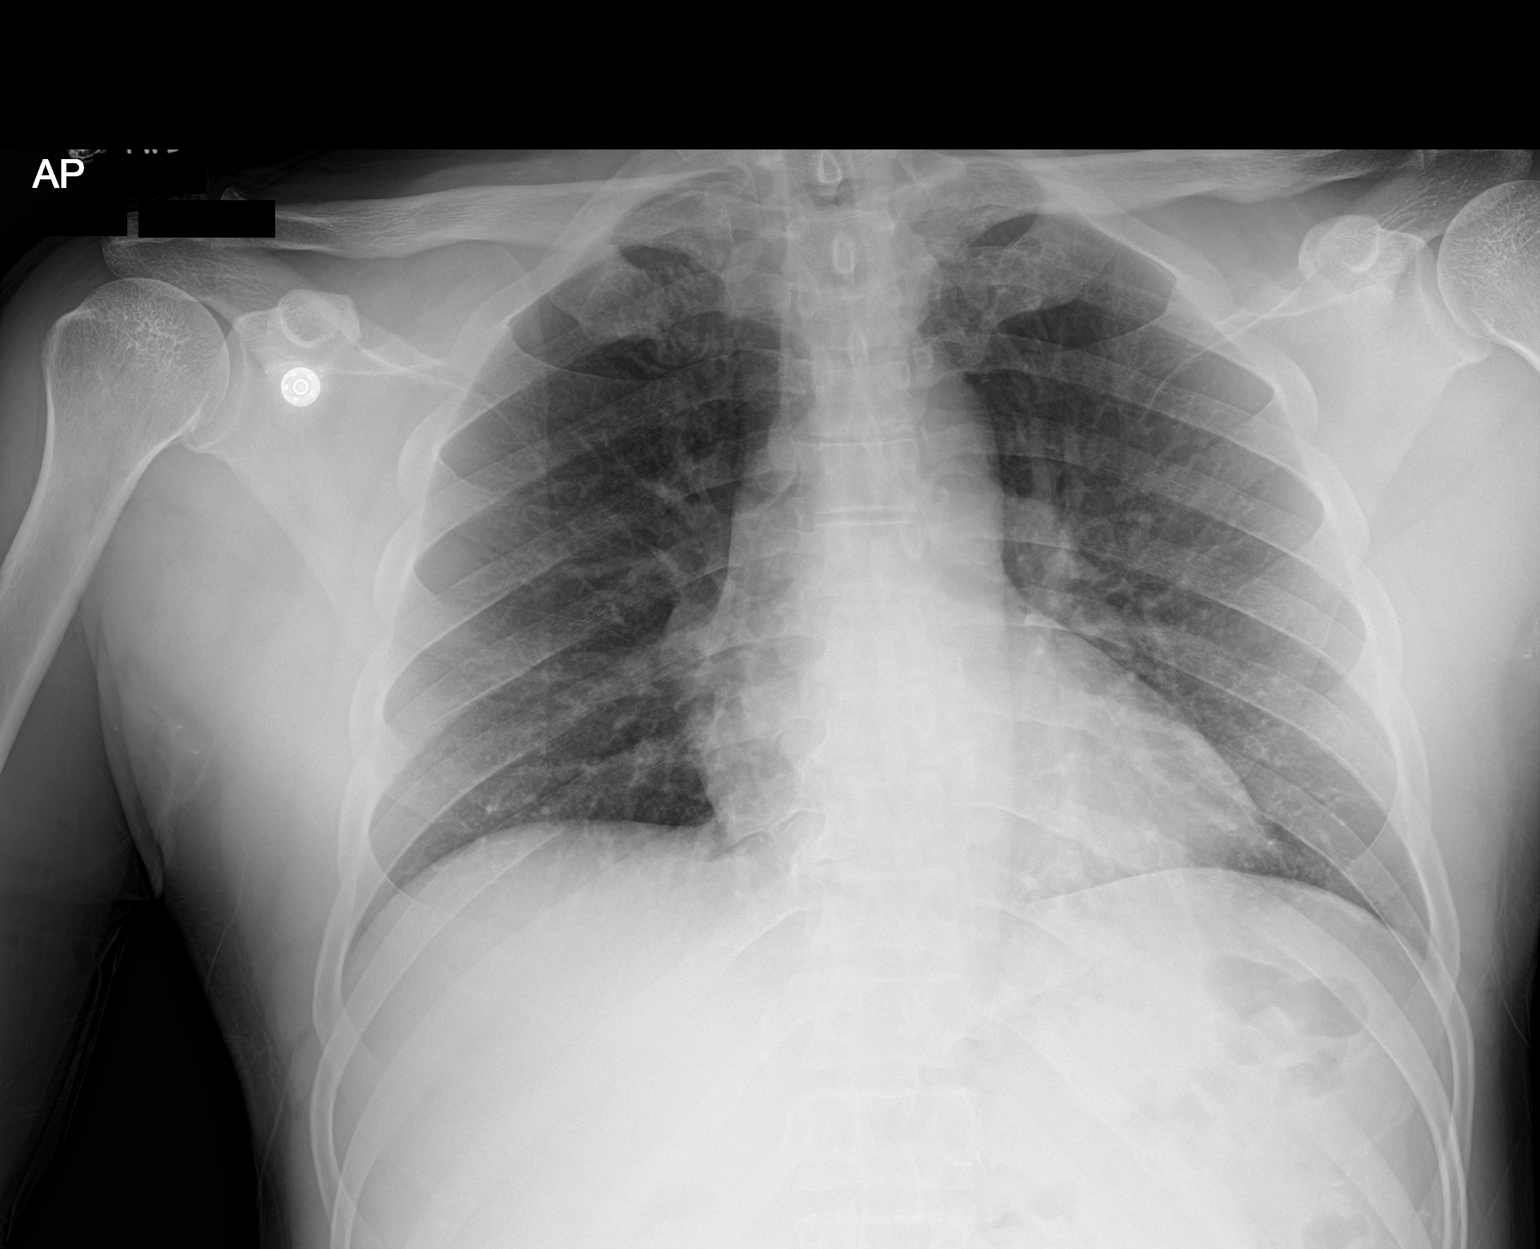

[1 of 1 positions shown; findings below may reference images not displayed]

FINDINGS: The heart size and mediastinal contours are within normal limits.
Both lungs are clear. The visualized skeletal structures are
unremarkable.
IMPRESSION: No active disease.

## 2021-04-15 ENCOUNTER — Other Ambulatory Visit: Payer: Self-pay

## 2021-04-15 ENCOUNTER — Emergency Department (HOSPITAL_COMMUNITY): Payer: Self-pay

## 2021-04-15 ENCOUNTER — Encounter (HOSPITAL_COMMUNITY): Payer: Self-pay | Admitting: Emergency Medicine

## 2021-04-15 ENCOUNTER — Emergency Department (HOSPITAL_COMMUNITY)
Admission: EM | Admit: 2021-04-15 | Discharge: 2021-04-16 | Disposition: A | Discharge status: Home / Self Care | Payer: Self-pay | Attending: Emergency Medicine | Admitting: Emergency Medicine

## 2021-04-15 DIAGNOSIS — R739 Hyperglycemia, unspecified: Secondary | ICD-10-CM

## 2021-04-15 DIAGNOSIS — R42 Dizziness and giddiness: Secondary | ICD-10-CM | POA: Insufficient documentation

## 2021-04-15 DIAGNOSIS — E1165 Type 2 diabetes mellitus with hyperglycemia: Secondary | ICD-10-CM | POA: Insufficient documentation

## 2021-04-15 DIAGNOSIS — R61 Generalized hyperhidrosis: Secondary | ICD-10-CM | POA: Insufficient documentation

## 2021-04-15 DIAGNOSIS — Z7984 Long term (current) use of oral hypoglycemic drugs: Secondary | ICD-10-CM | POA: Insufficient documentation

## 2021-04-15 DIAGNOSIS — Z20822 Contact with and (suspected) exposure to covid-19: Secondary | ICD-10-CM | POA: Insufficient documentation

## 2021-04-15 LAB — COMPREHENSIVE METABOLIC PANEL
ALT: 37 U/L (ref 0–44)
AST: 27 U/L (ref 15–41)
Albumin: 4.1 g/dL (ref 3.5–5.0)
Alkaline Phosphatase: 78 U/L (ref 38–126)
Anion gap: 7 (ref 5–15)
BUN: 12 mg/dL (ref 6–20)
CO2: 29 mmol/L (ref 22–32)
Calcium: 9.9 mg/dL (ref 8.9–10.3)
Chloride: 99 mmol/L (ref 98–111)
Creatinine, Ser: 0.87 mg/dL (ref 0.61–1.24)
GFR, Estimated: >60 mL/min (ref >60)
Glucose, Bld: 324 mg/dL — ABNORMAL HIGH (ref 70–99)
Potassium: 3.9 mmol/L (ref 3.5–5.1)
Sodium: 135 mmol/L (ref 135–145)
Total Bilirubin: 0.3 mg/dL (ref 0.3–1.2)
Total Protein: 7.4 g/dL (ref 6.5–8.1)

## 2021-04-15 LAB — CBC WITH DIFFERENTIAL/PLATELET
Abs Immature Granulocytes: 0.02 10*3/uL (ref 0.00–0.07)
Basophils Absolute: 0 10*3/uL (ref 0.0–0.1)
Basophils Relative: 0 %
Eosinophils Absolute: 0.1 10*3/uL (ref 0.0–0.5)
Eosinophils Relative: 1 %
HCT: 41.3 % (ref 39.0–52.0)
Hemoglobin: 14.1 g/dL (ref 13.0–17.0)
Immature Granulocytes: 0 %
Lymphocytes Relative: 26 %
Lymphs Abs: 2 10*3/uL (ref 0.7–4.0)
MCH: 30.3 pg (ref 26.0–34.0)
MCHC: 34.1 g/dL (ref 30.0–36.0)
MCV: 88.8 fL (ref 80.0–100.0)
Monocytes Absolute: 0.5 10*3/uL (ref 0.1–1.0)
Monocytes Relative: 6 %
Neutro Abs: 5.2 10*3/uL (ref 1.7–7.7)
Neutrophils Relative %: 67 %
Platelets: 190 10*3/uL (ref 150–400)
RBC: 4.65 MIL/uL (ref 4.22–5.81)
RDW: 12.1 % (ref 11.5–15.5)
WBC: 7.8 10*3/uL (ref 4.0–10.5)
nRBC: 0 % (ref 0.0–0.2)

## 2021-04-15 LAB — URINALYSIS, ROUTINE W REFLEX MICROSCOPIC
Bacteria, UA: NONE SEEN
Bilirubin Urine: NEGATIVE
Glucose, UA: >=500 mg/dL — AB
Hgb urine dipstick: NEGATIVE
Ketones, ur: 5 mg/dL — AB
Leukocytes,Ua: NEGATIVE
Nitrite: NEGATIVE
Protein, ur: NEGATIVE mg/dL
Specific Gravity, Urine: 1.025 (ref 1.005–1.030)
pH: 6 (ref 5.0–8.0)

## 2021-04-15 LAB — BETA-HYDROXYBUTYRIC ACID: Beta-Hydroxybutyric Acid: 0.16 mmol/L (ref 0.05–0.27)

## 2021-04-15 LAB — RESP PANEL BY RT-PCR (FLU A&B, COVID) ARPGX2
Influenza A by PCR: NEGATIVE
Influenza B by PCR: NEGATIVE
SARS Coronavirus 2 by RT PCR: NEGATIVE

## 2021-04-15 LAB — CBG MONITORING, ED
Glucose-Capillary: 320 mg/dL — ABNORMAL HIGH (ref 70–99)
Glucose-Capillary: 94 mg/dL (ref 70–99)

## 2021-04-15 LAB — TROPONIN I (HIGH SENSITIVITY)
Troponin I (High Sensitivity): 2 ng/L (ref <18)
Troponin I (High Sensitivity): 2 ng/L (ref <18)

## 2021-04-15 MED ORDER — SODIUM CHLORIDE 0.9 % IV BOLUS
1000.0000 mL | Freq: Once | INTRAVENOUS | Status: AC
  Administered 2021-04-15: 1000 mL via INTRAVENOUS

## 2021-04-15 NOTE — ED Triage Notes (Signed)
Pt reports being type 2 diabetic. Pt reports CBG being in 400s yesterday. Reports taking insulin as directed. Pt states today he began feeling like he has lightheaded, palpitations, and heavy breathing. CBG in triage 320.

## 2021-04-15 NOTE — ED Provider Notes (Signed)
[facility] DEPT Provider Note   CSN: [id number] Arrival date & time: 04/15/21  1438     History  Chief Complaint  Patient presents with   Hyperglycemia    Brandon Boyer is a 51 y.o. male.  51yo man presents with 1 day of feeling unwell. Yesterday he did not feel quite right and he checked his blood sugar, which was >400. Patient then took his medication (novolog 70/30) and his BG was improved to 230s. This morning he felt fine and his fasting BG was in the mid 200s. He then took his son to school and came home. He started sweating, feeling dizzy, shaky, dripping sweat and overall felt very ill. He denies chest pain, SOB, n/v/d, and abdominal pain. He sat down and rested and had 3 bowls of cheerio's and felt better. His daughter came over some time and they checked his blood glucose then which was stable in the mid 200s. He is a veteran who goes to the [place] and takes metformin 1000 mg BID as well as novolog 70/30, not on a statin.      Home Medications Prior to Admission medications   Medication Sig Start Date End Date Taking? Authorizing Provider  dexamethasone (DECADRON) 4 MG tablet Take 1 tablet (4 mg total) by mouth 2 (two) times daily with a meal. 08/28/17   Virgel Manifold, MD  diclofenac (VOLTAREN) 75 MG EC tablet Take 75 mg by mouth 2 (two) times daily.    [provider]  HYDROcodone-acetaminophen (NORCO/VICODIN) 5-325 MG tablet Take 1 tablet by mouth every 6 (six) hours as needed for moderate pain.    [provider]  meloxicam (MOBIC) 15 MG tablet Take 15 mg by mouth 2 (two) times daily as needed for pain.    [provider]  metFORMIN (GLUCOPHAGE) 500 MG tablet Take 500 mg by mouth 2 (two) times daily with a meal.     [provider]  methocarbamol (ROBAXIN) 500 MG tablet Take 500 mg by mouth 4 (four) times daily.    [provider]  predniSONE (DELTASONE) 5 MG tablet Take 5 mg by mouth 3 (three) times  daily.    [provider]      Allergies    Pork-derived products    Review of Systems   Review of Systems  Constitutional:  Positive for diaphoresis. Negative for activity change, appetite change, chills, fatigue and fever.  HENT:  Negative for rhinorrhea, sneezing and sore throat.   Respiratory:  Negative for cough, shortness of breath and wheezing.   Cardiovascular:  Negative for chest pain, palpitations and leg swelling.  Gastrointestinal:  Negative for abdominal distention, abdominal pain, constipation, diarrhea, nausea and vomiting.  Genitourinary:  Negative for difficulty urinating, dysuria, flank pain, frequency, hematuria and urgency.  Musculoskeletal:  Negative for arthralgias.  Neurological:  Negative for dizziness, seizures, syncope, weakness, light-headedness, numbness and headaches.  All other systems reviewed and are negative.  Physical Exam Updated Vital Signs BP 102/66    Pulse 71    Temp 98.6 F (37 C) (Oral)    Resp 14    SpO2 97%  Physical Exam Vitals and nursing note reviewed.  Constitutional:      General: He is not in acute distress.    Appearance: Normal appearance. He is normal weight. He is not ill-appearing, toxic-appearing or diaphoretic.  HENT:     Head: Normocephalic and atraumatic.     Nose: No congestion.     Mouth/Throat:  Mouth: Mucous membranes are moist.     Pharynx: Oropharynx is clear.  Eyes:     Conjunctiva/sclera: Conjunctivae normal.     Pupils: Pupils are equal, round, and reactive to light.  Cardiovascular:     Rate and Rhythm: Normal rate and regular rhythm.     Pulses: Normal pulses.     Heart sounds: Normal heart sounds.  Pulmonary:     Effort: Pulmonary effort is normal.     Breath sounds: Normal breath sounds.  Abdominal:     General: Abdomen is flat. Bowel sounds are normal.     Palpations: Abdomen is soft.     Tenderness: There is no abdominal tenderness. There is no guarding or rebound.  Musculoskeletal:      Right lower leg: No edema.     Left lower leg: No edema.  Lymphadenopathy:     Cervical: No cervical adenopathy.  Skin:    General: Skin is warm and dry.     Capillary Refill: Capillary refill takes less than 2 seconds.  Neurological:     General: No focal deficit present.     Mental Status: He is alert and oriented to person, place, and time. Mental status is at baseline.  Psychiatric:        Mood and Affect: Mood normal.        Behavior: Behavior normal.    ED Results / Procedures / Treatments   Labs (all labs ordered are listed, but only abnormal results are displayed) Labs Reviewed  COMPREHENSIVE METABOLIC PANEL - Abnormal; Notable for the following components:      Result Value   Glucose, Bld 324 (*)    All other components within normal limits  URINALYSIS, ROUTINE W REFLEX MICROSCOPIC - Abnormal; Notable for the following components:   Glucose, UA >=500 (*)    Ketones, ur 5 (*)    All other components within normal limits  CBG MONITORING, ED - Abnormal; Notable for the following components:   Glucose-Capillary 320 (*)    All other components within normal limits  RESP PANEL BY RT-PCR (FLU A&B, COVID) [id number]  CBC WITH DIFFERENTIAL/PLATELET  BETA-HYDROXYBUTYRIC ACID  CBG MONITORING, ED  CBG MONITORING, ED  TROPONIN I (HIGH SENSITIVITY)  TROPONIN I (HIGH SENSITIVITY)    EKG EKG Interpretation  Date/Time:  Thursday April 15 2021 22:43:48 EST Ventricular Rate:  74 PR Interval:  160 QRS Duration: 92 QT Interval:  388 QTC Calculation: 431 R Axis:   15 Text Interpretation: Sinus rhythm Borderline ST elevation, lateral leads No significant change since last tracing Confirmed by Wandra Arthurs [phone]) on 04/15/2021 10:58:23 PM  Radiology DG Chest Port 1 View  Result Date: 04/15/2021 CLINICAL DATA:  Chills hyperglycemia EXAM: PORTABLE CHEST 1 VIEW COMPARISON:  01/31/2019 FINDINGS: The heart size and mediastinal contours are within normal limits. Both lungs are clear.  The visualized skeletal structures are unremarkable. IMPRESSION: No active disease. Electronically Signed   By: Donavan Foil M.D.   On: 04/15/2021 20:19    Procedures Procedures    Medications Ordered in ED Medications  sodium chloride 0.9 % bolus 1,000 mL (0 mLs Intravenous Stopped 04/15/21 2321)    ED Course/ Medical Decision Making/ A&P Clinical Course as of 04/15/21 2346  Thu Apr 15, 2021  2024 CXR clear. [CM]  2046 VSS. [CM]  2102 Covid and flu negative. First troponin negative. [CM]  [id number] EKG sinus rhythm. Awaiting second troponin. [CM]  2314 2nd troponin negative, will check repeat cbg  [CM]  2314 CBG 94, has not eaten. Recommend patient eat, and then can take novolog. As his fasting BG is 230s, recommend increasing novolog 70/30 to 50 units BID and follow up with PCP. Patient is in agreement with plan. [CM]    Clinical Course User Index [CM] Gladys Damme, MD                           Medical Decision Making 51 yo man presents with an episode of sweating and feeling unwell in setting of poorly controlled DMT2. CMP WNL, elevated glucose to 320s, but no AG, no electrolyte derangement, BHB WNL. Awaiting UA. Patient denies chest pain, sweating, no infectious symptoms. Even though no chest pain, will evaluate for cardiac etiology given high overall risk based on age and comorbidities. Will obtain EKG and troponins.  Amount and/or Complexity of Data Reviewed Independent Historian: spouse Labs: ordered. Decision-making details documented in ED Course. Radiology: ordered. ECG/medicine tests: ordered. Decision-making details documented in ED Course.         Final Clinical Impression(s) / ED Diagnoses Final diagnoses:  Hyperglycemia    Rx / DC Orders ED Discharge Orders     None         Gladys Damme, MD 04/15/21 2347    Drenda Freeze, MD 04/20/21 [phone]

## 2021-04-15 NOTE — ED Provider Triage Note (Signed)
Emergency Medicine Provider Triage Evaluation Note  Brandon Boyer , a 51 y.o. male  was evaluated in triage.  Pt complains of glucose is high.  Pt feels sick.   Review of Systems  Positive: Heart racing and sweaty Negative: Fever or cough  Physical Exam  BP 138/83    Pulse 81    Temp 98.6 F (37 C) (Oral)    Resp 18    SpO2 96%  Gen:   Awake, no distress   Resp:  Normal effort  MSK:   Moves extremities without difficulty  Other:    Medical Decision Making  Medically screening exam initiated at 3:25 PM.  Appropriate orders placed.  Brandon Boyer was informed that the remainder of the evaluation will be completed by another provider, this initial triage assessment does not replace that evaluation, and the importance of remaining in the ED until their evaluation is complete.     Brandon Boyer, [place] 04/15/21 1525

## 2021-04-15 NOTE — Discharge Instructions (Addendum)
If you have another episode like you did earlier today, check your blood sugar as soon as you can. Ideally while you are having the symptoms.   I recommend you eat something, and then take your Novolog. Increase your novolog to 50 units twice a day. Follow up with your primary care doctor as soon as you can.

## 2021-10-03 ENCOUNTER — Emergency Department (HOSPITAL_COMMUNITY)
Admission: EM | Admit: 2021-10-03 | Discharge: 2021-10-04 | Disposition: A | Discharge status: Home / Self Care | Payer: PRIVATE HEALTH INSURANCE | Attending: Emergency Medicine | Admitting: Emergency Medicine

## 2021-10-03 ENCOUNTER — Emergency Department (HOSPITAL_COMMUNITY): Payer: PRIVATE HEALTH INSURANCE

## 2021-10-03 ENCOUNTER — Encounter (HOSPITAL_COMMUNITY): Payer: Self-pay | Admitting: Student

## 2021-10-03 DIAGNOSIS — R739 Hyperglycemia, unspecified: Secondary | ICD-10-CM

## 2021-10-03 DIAGNOSIS — S20312A Abrasion of left front wall of thorax, initial encounter: Secondary | ICD-10-CM | POA: Diagnosis not present

## 2021-10-03 DIAGNOSIS — R000 Tachycardia, unspecified: Secondary | ICD-10-CM | POA: Insufficient documentation

## 2021-10-03 DIAGNOSIS — M25562 Pain in left knee: Secondary | ICD-10-CM | POA: Insufficient documentation

## 2021-10-03 DIAGNOSIS — Y92410 Unspecified street and highway as the place of occurrence of the external cause: Secondary | ICD-10-CM | POA: Insufficient documentation

## 2021-10-03 DIAGNOSIS — S069XAA Unspecified intracranial injury with loss of consciousness status unknown, initial encounter: Secondary | ICD-10-CM | POA: Diagnosis not present

## 2021-10-03 DIAGNOSIS — M79651 Pain in right thigh: Secondary | ICD-10-CM | POA: Insufficient documentation

## 2021-10-03 DIAGNOSIS — M25532 Pain in left wrist: Secondary | ICD-10-CM | POA: Diagnosis not present

## 2021-10-03 DIAGNOSIS — E1165 Type 2 diabetes mellitus with hyperglycemia: Secondary | ICD-10-CM | POA: Diagnosis not present

## 2021-10-03 DIAGNOSIS — R079 Chest pain, unspecified: Secondary | ICD-10-CM | POA: Diagnosis not present

## 2021-10-03 DIAGNOSIS — M542 Cervicalgia: Secondary | ICD-10-CM | POA: Insufficient documentation

## 2021-10-03 DIAGNOSIS — Z7984 Long term (current) use of oral hypoglycemic drugs: Secondary | ICD-10-CM | POA: Diagnosis not present

## 2021-10-03 DIAGNOSIS — R519 Headache, unspecified: Secondary | ICD-10-CM | POA: Diagnosis not present

## 2021-10-03 DIAGNOSIS — M25561 Pain in right knee: Secondary | ICD-10-CM | POA: Insufficient documentation

## 2021-10-03 DIAGNOSIS — S0990XA Unspecified injury of head, initial encounter: Secondary | ICD-10-CM | POA: Diagnosis present

## 2021-10-03 DIAGNOSIS — R1030 Lower abdominal pain, unspecified: Secondary | ICD-10-CM | POA: Insufficient documentation

## 2021-10-03 DIAGNOSIS — M79652 Pain in left thigh: Secondary | ICD-10-CM | POA: Insufficient documentation

## 2021-10-03 LAB — PROTIME-INR
INR: 0.9 (ref 0.8–1.2)
Prothrombin Time: 12 seconds (ref 11.4–15.2)

## 2021-10-03 LAB — I-STAT CHEM 8, ED
BUN: 16 mg/dL (ref 6–20)
Calcium, Ion: 1.21 mmol/L (ref 1.15–1.40)
Chloride: 98 mmol/L (ref 98–111)
Creatinine, Ser: 0.8 mg/dL (ref 0.61–1.24)
Glucose, Bld: 358 mg/dL — ABNORMAL HIGH (ref 70–99)
HCT: 46 % (ref 39.0–52.0)
Hemoglobin: 15.6 g/dL (ref 13.0–17.0)
Potassium: 3.8 mmol/L (ref 3.5–5.1)
Sodium: 137 mmol/L (ref 135–145)
TCO2: 29 mmol/L (ref 22–32)

## 2021-10-03 LAB — COMPREHENSIVE METABOLIC PANEL
ALT: 38 U/L (ref 0–44)
AST: 29 U/L (ref 15–41)
Albumin: 3.9 g/dL (ref 3.5–5.0)
Alkaline Phosphatase: 81 U/L (ref 38–126)
Anion gap: 8 (ref 5–15)
BUN: 14 mg/dL (ref 6–20)
CO2: 28 mmol/L (ref 22–32)
Calcium: 9.3 mg/dL (ref 8.9–10.3)
Chloride: 100 mmol/L (ref 98–111)
Creatinine, Ser: 0.95 mg/dL (ref 0.61–1.24)
GFR, Estimated: >60 mL/min (ref >60)
Glucose, Bld: 368 mg/dL — ABNORMAL HIGH (ref 70–99)
Potassium: 3.9 mmol/L (ref 3.5–5.1)
Sodium: 136 mmol/L (ref 135–145)
Total Bilirubin: 0.5 mg/dL (ref 0.3–1.2)
Total Protein: 7.1 g/dL (ref 6.5–8.1)

## 2021-10-03 LAB — CBC
HCT: 43.3 % (ref 39.0–52.0)
Hemoglobin: 15.1 g/dL (ref 13.0–17.0)
MCH: 30.3 pg (ref 26.0–34.0)
MCHC: 34.9 g/dL (ref 30.0–36.0)
MCV: 86.9 fL (ref 80.0–100.0)
Platelets: 194 10*3/uL (ref 150–400)
RBC: 4.98 MIL/uL (ref 4.22–5.81)
RDW: 11.7 % (ref 11.5–15.5)
WBC: 5.2 10*3/uL (ref 4.0–10.5)
nRBC: 0 % (ref 0.0–0.2)

## 2021-10-03 LAB — SAMPLE TO BLOOD BANK

## 2021-10-03 LAB — ETHANOL: Alcohol, Ethyl (B): <10 mg/dL (ref <10)

## 2021-10-03 MED ORDER — SODIUM CHLORIDE 0.9 % IV BOLUS
1000.0000 mL | Freq: Once | INTRAVENOUS | Status: AC
  Administered 2021-10-03: 1000 mL via INTRAVENOUS

## 2021-10-03 MED ORDER — ONDANSETRON HCL 4 MG/2ML IJ SOLN
4.0000 mg | Freq: Once | INTRAMUSCULAR | Status: AC
  Administered 2021-10-03: 4 mg via INTRAVENOUS
  Filled 2021-10-03: qty 2

## 2021-10-03 MED ORDER — MORPHINE SULFATE (PF) 4 MG/ML IV SOLN
4.0000 mg | Freq: Once | INTRAVENOUS | Status: AC
  Administered 2021-10-03: 4 mg via INTRAVENOUS
  Filled 2021-10-03: qty 1
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
## 2021-10-04 MED ORDER — HYDROMORPHONE HCL 1 MG/ML IJ SOLN
0.5000 mg | Freq: Once | INTRAMUSCULAR | Status: AC
  Administered 2021-10-04: 0.5 mg via INTRAVENOUS
  Filled 2021-10-04: qty 1

## 2021-10-04 MED ORDER — NAPROXEN 500 MG PO TABS: 500.0000 mg | ORAL_TABLET | Freq: Two times a day (BID) | ORAL | 0 refills | Status: DC | PRN

## 2021-10-04 MED ORDER — LIDOCAINE 5 % EX PTCH: 1.0000 | MEDICATED_PATCH | Freq: Every day | CUTANEOUS | 0 refills | Status: AC | PRN

## 2021-10-04 MED ORDER — IOHEXOL 350 MG/ML SOLN
100.0000 mL | Freq: Once | INTRAVENOUS | Status: AC | PRN
  Administered 2021-10-04: 100 mL via INTRAVENOUS

## 2021-10-04 MED ORDER — METHOCARBAMOL 500 MG PO TABS: 500.0000 mg | ORAL_TABLET | Freq: Three times a day (TID) | ORAL | 0 refills | Status: DC | PRN

## 2021-10-04 MED ORDER — KETOROLAC TROMETHAMINE 15 MG/ML IJ SOLN
15.0000 mg | Freq: Once | INTRAMUSCULAR | Status: AC
  Administered 2021-10-04: 15 mg via INTRAVENOUS
  Filled 2021-10-04: qty 1
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
## 2021-12-15 ENCOUNTER — Other Ambulatory Visit: Payer: Self-pay

## 2021-12-15 ENCOUNTER — Emergency Department (HOSPITAL_COMMUNITY)
Admission: EM | Admit: 2021-12-15 | Discharge: 2021-12-15 | Disposition: A | Discharge status: Home / Self Care | Payer: BC Managed Care – PPO | Attending: Emergency Medicine | Admitting: Emergency Medicine

## 2021-12-15 DIAGNOSIS — M79604 Pain in right leg: Secondary | ICD-10-CM | POA: Diagnosis not present

## 2021-12-15 DIAGNOSIS — M5441 Lumbago with sciatica, right side: Secondary | ICD-10-CM | POA: Diagnosis present

## 2021-12-15 DIAGNOSIS — Z7984 Long term (current) use of oral hypoglycemic drugs: Secondary | ICD-10-CM | POA: Diagnosis not present

## 2021-12-15 DIAGNOSIS — Z79899 Other long term (current) drug therapy: Secondary | ICD-10-CM | POA: Diagnosis not present

## 2021-12-15 MED ORDER — CYCLOBENZAPRINE HCL 5 MG PO TABS: 5.0000 mg | ORAL_TABLET | Freq: Two times a day (BID) | ORAL | 0 refills | Status: AC | PRN

## 2021-12-15 MED ORDER — MELOXICAM 7.5 MG PO TABS: 7.5000 mg | ORAL_TABLET | Freq: Every day | ORAL | 0 refills | Status: AC
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
## 2021-12-15 NOTE — Discharge Instructions (Signed)
At this time there does not appear to be the presence of an emergent medical condition, however there is always the potential for conditions to change. Please read and follow the below instructions.  Please return to the Emergency Department immediately for any new or worsening symptoms. Please be sure to follow up with your Primary Care Provider within one week regarding your visit today; please call their office to schedule an appointment even if you are feeling better for a follow-up visit. You may use the muscle relaxer Flexeril as prescribed to help with your symptoms.  Do not drive or operate heavy machinery while taking Flexeril as it will make you drowsy.  Do not drink alcohol or take other sedating medications while taking Flexeril as this will worsen side effects.  Stop taking the Robaxin. You have been prescribed an NSAID-containing medication called Mobic today.  Do not take the medications including ibuprofen, Aleve, Advil or other NSAID-containing medications while taking Mobic .  Please be sure to drink enough water.   Please read the additional information packets attached to your discharge summary.  Go to the nearest Emergency Department immediately if: You have fever or chills You develop new bowel or bladder control problems. You have unusual weakness or numbness in your arms or legs. You feel faint. You have any new/concerning or worsening of symptoms.  Do not take your medicine if  develop an itchy rash, swelling in your mouth or lips, or difficulty breathing; call 911 and seek immediate emergency medical attention if this occurs.  You may review your lab tests and imaging results in their entirety on your MyChart account.  Please discuss all results of fully with your primary care provider and other specialist at your follow-up visit.  Note: Portions of this text may have been transcribed using voice recognition software. Every effort was made to ensure accuracy; however,  inadvertent computerized transcription errors may still be present.
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
## 2022-05-13 ENCOUNTER — Emergency Department (HOSPITAL_COMMUNITY)
Admission: EM | Admit: 2022-05-13 | Discharge: 2022-05-13 | Disposition: A | Discharge status: Home / Self Care | Payer: BC Managed Care – PPO | Attending: Emergency Medicine | Admitting: Emergency Medicine

## 2022-05-13 ENCOUNTER — Other Ambulatory Visit: Payer: Self-pay

## 2022-05-13 ENCOUNTER — Encounter (HOSPITAL_COMMUNITY): Payer: Self-pay

## 2022-05-13 ENCOUNTER — Emergency Department (HOSPITAL_COMMUNITY): Payer: BC Managed Care – PPO

## 2022-05-13 DIAGNOSIS — Z794 Long term (current) use of insulin: Secondary | ICD-10-CM | POA: Diagnosis not present

## 2022-05-13 DIAGNOSIS — R739 Hyperglycemia, unspecified: Secondary | ICD-10-CM

## 2022-05-13 DIAGNOSIS — E1165 Type 2 diabetes mellitus with hyperglycemia: Secondary | ICD-10-CM | POA: Insufficient documentation

## 2022-05-13 DIAGNOSIS — R1032 Left lower quadrant pain: Secondary | ICD-10-CM | POA: Insufficient documentation

## 2022-05-13 DIAGNOSIS — R1031 Right lower quadrant pain: Secondary | ICD-10-CM | POA: Insufficient documentation

## 2022-05-13 DIAGNOSIS — R5383 Other fatigue: Secondary | ICD-10-CM | POA: Diagnosis present

## 2022-05-13 DIAGNOSIS — Z7984 Long term (current) use of oral hypoglycemic drugs: Secondary | ICD-10-CM | POA: Diagnosis not present

## 2022-05-13 HISTORY — DX: Depression, unspecified: F32.A

## 2022-05-13 LAB — CBG MONITORING, ED
Glucose-Capillary: 362 mg/dL — ABNORMAL HIGH (ref 70–99)
Glucose-Capillary: 277 mg/dL — ABNORMAL HIGH (ref 70–99)

## 2022-05-13 LAB — LIPASE, BLOOD: Lipase: 29 U/L (ref 11–51)

## 2022-05-13 LAB — CK: Total CK: 89 U/L (ref 49–397)

## 2022-05-13 LAB — TROPONIN I (HIGH SENSITIVITY): Troponin I (High Sensitivity): 3 ng/L (ref <18)

## 2022-05-13 MED ORDER — IOHEXOL 350 MG/ML SOLN
80.0000 mL | Freq: Once | INTRAVENOUS | Status: AC | PRN
  Administered 2022-05-13: 80 mL via INTRAVENOUS

## 2022-05-13 MED ORDER — INSULIN ASPART 100 UNIT/ML IJ SOLN
5.0000 [IU] | Freq: Once | INTRAMUSCULAR | Status: AC
  Administered 2022-05-13: 5 [IU] via INTRAVENOUS
  Filled 2022-05-13: qty 0.05

## 2022-05-13 MED ORDER — LACTATED RINGERS IV BOLUS
1000.0000 mL | Freq: Once | INTRAVENOUS | Status: AC
  Administered 2022-05-13: 1000 mL via INTRAVENOUS

## 2022-05-13 NOTE — ED Notes (Signed)
Patient transported to CT 
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
## 2022-05-13 NOTE — Discharge Instructions (Addendum)
Your blood test today look good.  The CAT scan showed no sign of blood clots.  No sign of heart attacks.  Your pancreas looks normal.  Think that your symptoms may be caused from your blood sugar being high.  In addition to using the Trulicity like you normally do you may have to do sliding scale insulin while your sugar is still running high.  Continue to drink plenty of fluids get some rest.  If you start feeling worse return to the emergency room.

## 2022-05-13 NOTE — ED Triage Notes (Signed)
Pt sent to ED from pcp office pt reports sob, chest tightness, fatigue and "burning in legs" Pt states was told d dimer was elevated at 15

## 2023-08-17 ENCOUNTER — Emergency Department (HOSPITAL_COMMUNITY)
Admission: EM | Admit: 2023-08-17 | Discharge: 2023-08-18 | Disposition: A | Discharge status: Home / Self Care | Attending: Emergency Medicine | Admitting: Emergency Medicine

## 2023-08-17 ENCOUNTER — Encounter (HOSPITAL_COMMUNITY): Payer: Self-pay | Admitting: Emergency Medicine

## 2023-08-17 ENCOUNTER — Emergency Department (HOSPITAL_COMMUNITY)

## 2023-08-17 ENCOUNTER — Other Ambulatory Visit: Payer: Self-pay

## 2023-08-17 DIAGNOSIS — K5900 Constipation, unspecified: Secondary | ICD-10-CM | POA: Insufficient documentation

## 2023-08-17 DIAGNOSIS — Z794 Long term (current) use of insulin: Secondary | ICD-10-CM | POA: Insufficient documentation

## 2023-08-17 DIAGNOSIS — K760 Fatty (change of) liver, not elsewhere classified: Secondary | ICD-10-CM | POA: Insufficient documentation

## 2023-08-17 DIAGNOSIS — I700 Atherosclerosis of aorta: Secondary | ICD-10-CM | POA: Insufficient documentation

## 2023-08-17 DIAGNOSIS — R0789 Other chest pain: Secondary | ICD-10-CM

## 2023-08-17 DIAGNOSIS — M5126 Other intervertebral disc displacement, lumbar region: Secondary | ICD-10-CM | POA: Diagnosis not present

## 2023-08-17 DIAGNOSIS — R1011 Right upper quadrant pain: Secondary | ICD-10-CM | POA: Diagnosis present

## 2023-08-17 LAB — TROPONIN I (HIGH SENSITIVITY)
Troponin I (High Sensitivity): 2 ng/L (ref <18)
Troponin I (High Sensitivity): <2 ng/L (ref <18)

## 2023-08-17 LAB — BASIC METABOLIC PANEL WITH GFR
Anion gap: 11 (ref 5–15)
BUN: 12 mg/dL (ref 6–20)
CO2: 24 mmol/L (ref 22–32)
Calcium: 10.2 mg/dL (ref 8.9–10.3)
Chloride: 101 mmol/L (ref 98–111)
Creatinine, Ser: 0.9 mg/dL (ref 0.61–1.24)
GFR, Estimated: >60 mL/min (ref >60)
Glucose, Bld: 217 mg/dL — ABNORMAL HIGH (ref 70–99)
Potassium: 4.1 mmol/L (ref 3.5–5.1)
Sodium: 136 mmol/L (ref 135–145)

## 2023-08-17 LAB — HEPATIC FUNCTION PANEL
ALT: 31 U/L (ref 0–44)
AST: 31 U/L (ref 15–41)
Albumin: 4.3 g/dL (ref 3.5–5.0)
Alkaline Phosphatase: 71 U/L (ref 38–126)
Bilirubin, Direct: 0.2 mg/dL (ref 0.0–0.2)
Indirect Bilirubin: 0.6 mg/dL (ref 0.3–0.9)
Total Bilirubin: 0.8 mg/dL (ref 0.0–1.2)
Total Protein: 7.8 g/dL (ref 6.5–8.1)

## 2023-08-17 LAB — COMPREHENSIVE METABOLIC PANEL WITH GFR
ALT: 30 U/L (ref 0–44)
AST: 27 U/L (ref 15–41)
Albumin: 4.5 g/dL (ref 3.5–5.0)
Alkaline Phosphatase: 74 U/L (ref 38–126)
Anion gap: 11 (ref 5–15)
BUN: 12 mg/dL (ref 6–20)
CO2: 23 mmol/L (ref 22–32)
Calcium: 10.3 mg/dL (ref 8.9–10.3)
Chloride: 102 mmol/L (ref 98–111)
Creatinine, Ser: 0.87 mg/dL (ref 0.61–1.24)
GFR, Estimated: >60 mL/min (ref >60)
Glucose, Bld: 211 mg/dL — ABNORMAL HIGH (ref 70–99)
Potassium: 4.2 mmol/L (ref 3.5–5.1)
Sodium: 136 mmol/L (ref 135–145)
Total Bilirubin: 0.9 mg/dL (ref 0.0–1.2)
Total Protein: 7.9 g/dL (ref 6.5–8.1)

## 2023-08-17 LAB — URINALYSIS, ROUTINE W REFLEX MICROSCOPIC
Bilirubin Urine: NEGATIVE
Glucose, UA: 50 mg/dL — AB
Hgb urine dipstick: NEGATIVE
Ketones, ur: NEGATIVE mg/dL
Leukocytes,Ua: NEGATIVE
Nitrite: NEGATIVE
Protein, ur: NEGATIVE mg/dL
Specific Gravity, Urine: 1.006 (ref 1.005–1.030)
pH: 6 (ref 5.0–8.0)

## 2023-08-17 LAB — CBC
HCT: 41.6 % (ref 39.0–52.0)
Hemoglobin: 14 g/dL (ref 13.0–17.0)
MCH: 30 pg (ref 26.0–34.0)
MCHC: 33.7 g/dL (ref 30.0–36.0)
MCV: 89.3 fL (ref 80.0–100.0)
Platelets: 218 10*3/uL (ref 150–400)
RBC: 4.66 MIL/uL (ref 4.22–5.81)
RDW: 12 % (ref 11.5–15.5)
WBC: 5.1 10*3/uL (ref 4.0–10.5)
nRBC: 0 % (ref 0.0–0.2)

## 2023-08-17 LAB — LIPASE, BLOOD: Lipase: 28 U/L (ref 11–51)

## 2023-08-17 MED ORDER — MORPHINE SULFATE (PF) 4 MG/ML IV SOLN
4.0000 mg | Freq: Once | INTRAVENOUS | Status: AC
  Administered 2023-08-17: 4 mg via INTRAVENOUS
  Filled 2023-08-17: qty 1

## 2023-08-17 MED ORDER — IOHEXOL 300 MG/ML  SOLN
100.0000 mL | Freq: Once | INTRAMUSCULAR | Status: AC | PRN
  Administered 2023-08-17: 100 mL via INTRAVENOUS

## 2023-08-17 MED ORDER — SODIUM CHLORIDE 0.9 % IV BOLUS
1000.0000 mL | Freq: Once | INTRAVENOUS | Status: AC
  Administered 2023-08-17: 1000 mL via INTRAVENOUS

## 2023-08-17 MED ORDER — ONDANSETRON HCL 4 MG/2ML IJ SOLN
4.0000 mg | Freq: Once | INTRAMUSCULAR | Status: AC
  Administered 2023-08-17: 4 mg via INTRAVENOUS
  Filled 2023-08-17: qty 2

## 2023-08-17 MED ORDER — SODIUM CHLORIDE (PF) 0.9 % IJ SOLN
INTRAMUSCULAR | Status: AC
  Filled 2023-08-17: qty 50

## 2023-08-17 NOTE — ED Provider Notes (Signed)
 [place] [facility] AT [facility] Provider Note   CSN: [id number] Arrival date & time: 08/17/23  1218     Patient presents with: Chest Pain   Brandon Boyer is a 53 y.o. male.   53 y.o male with a PMH of diabetes presents to the ED with a chief complaint of right upper quadrant pain, chest pain for the past 3 days.  Patient endorses a sharp stabbing pain to the right upper quadrant which began Monday, no alleviating or exacerbating factors.  In addition, he endorses some nausea, I feel like I am going to vomit .  His last oral intake was yesterday with a chicken salad, has not eaten anything today as he reports underlying nausea.  He has not taken any medication for improvement in his symptoms.  He feels that the pain radiates from his right upper quadrant to his right chest.  Evaluated urgent care earlier, found to have a positive Murphy sign's, recommended to be evaluated in the ER with an ultrasound.  No prior history of alcohol abuse, no fever, no vomiting, no prior surgeries to his abdomen.   The history is provided by the patient.  Chest Pain Pain location:  Substernal area Pain quality: aching and sharp   Pain radiates to:  Does not radiate Pain severity:  Mild Onset quality:  Sudden Associated symptoms: abdominal pain and nausea   Associated symptoms: no back pain, no fever, no shortness of breath and no vomiting        Prior to Admission medications   Medication Sig Start Date End Date Taking? Authorizing Provider  polyethylene glycol powder (GLYCOLAX/MIRALAX) 17 GM/SCOOP powder Take 17 g by mouth 2 (two) times daily. Until daily soft stools OTC 08/18/23  Yes Vicky Charleston, PA-C  cyclobenzaprine  (FLEXERIL ) 5 MG tablet Take 1 tablet (5 mg total) by mouth 2 (two) times daily as needed for muscle spasms. 12/15/21   Donah Penne LABOR, PA-C  HYDROcodone-acetaminophen (NORCO/VICODIN) 5-325 MG tablet Take 1 tablet by mouth every 6 (six) hours as  needed for moderate pain or severe pain.    [provider]  insulin  aspart protamine- aspart (NOVOLOG  MIX 70/30) (70-30) 100 UNIT/ML injection Inject 46 Units into the skin 2 (two) times daily with a meal.    [provider]  lidocaine  (LIDODERM ) 5 % Place 1 patch onto the skin daily as needed. Apply patch to area most significant pain once per day.  Remove and discard patch within 12 hours of application. 10/04/21   Petrucelli, Samantha R, PA-C  metFORMIN (GLUCOPHAGE) 500 MG tablet Take 500 mg by mouth in the morning, at noon, and at bedtime.    [provider]    Allergies: Diclofenac and Pork-derived products    Review of Systems  Constitutional:  Negative for chills and fever.  Respiratory:  Negative for shortness of breath.   Cardiovascular:  Positive for chest pain.  Gastrointestinal:  Positive for abdominal pain and nausea. Negative for constipation, diarrhea and vomiting.  Genitourinary:  Negative for flank pain.  Musculoskeletal:  Negative for back pain.  All other systems reviewed and are negative.   Updated Vital Signs BP 128/71   Pulse 63   Temp 97.6 F (36.4 C)   Resp 16   SpO2 100%   Physical Exam Vitals and nursing note reviewed.  Constitutional:      Appearance: He is well-developed.  HENT:     Head: Normocephalic and atraumatic.   Eyes:     General:  No scleral icterus.    Pupils: Pupils are equal, round, and reactive to light.    Cardiovascular:     Rate and Rhythm: Normal rate.     Heart sounds: Normal heart sounds.  Pulmonary:     Effort: Pulmonary effort is normal.     Breath sounds: Normal breath sounds. No wheezing.  Chest:     Chest wall: No tenderness.  Abdominal:     General: Bowel sounds are normal. There is no distension.     Palpations: Abdomen is soft.     Tenderness: There is abdominal tenderness in the right upper quadrant.     Comments: Hyperactive bowel sounds, tenderness along the right upper quadrant.  No  visual hernia.   Musculoskeletal:        General: No tenderness or deformity.     Cervical back: Normal range of motion.     Right lower leg: No edema.     Left lower leg: No edema.   Skin:    General: Skin is warm and dry.   Neurological:     Mental Status: He is alert and oriented to person, place, and time.     (all labs ordered are listed, but only abnormal results are displayed) Labs Reviewed  BASIC METABOLIC PANEL WITH GFR - Abnormal; Notable for the following components:      Result Value   Glucose, Bld 217 (*)    All other components within normal limits  COMPREHENSIVE METABOLIC PANEL WITH GFR - Abnormal; Notable for the following components:   Glucose, Bld 211 (*)    All other components within normal limits  URINALYSIS, ROUTINE W REFLEX MICROSCOPIC - Abnormal; Notable for the following components:   Color, Urine STRAW (*)    Glucose, UA 50 (*)    All other components within normal limits  CBC  LIPASE, BLOOD  HEPATIC FUNCTION PANEL  TROPONIN I (HIGH SENSITIVITY)  TROPONIN I (HIGH SENSITIVITY)    EKG: EKG Interpretation Date/Time:  Thursday August 17 2023 12:49:37 EDT Ventricular Rate:  75 PR Interval:  146 QRS Duration:  78 QT Interval:  353 QTC Calculation: 395 R Axis:   31  Text Interpretation: Sinus rhythm ST elevation suggests acute pericarditis No significant change since last tracing Confirmed by Dean Clarity [id number]) on 08/17/2023 4:24:07 PM  Radiology: No results found.    Procedures   Medications Ordered in the ED  ondansetron  (ZOFRAN ) injection 4 mg (4 mg Intravenous Given 08/17/23 1851)  sodium chloride  0.9 % bolus 1,000 mL (0 mLs Intravenous Stopped 08/17/23 2108)  morphine  (PF) 4 MG/ML injection 4 mg (4 mg Intravenous Given 08/17/23 1852)  iohexol  (OMNIPAQUE ) 300 MG/ML solution 100 mL (100 mLs Intravenous Contrast Given 08/17/23 2249)                                    Medical Decision Making Amount and/or Complexity of Data  Reviewed Labs: ordered. Radiology: ordered.  Risk Prescription drug management.    This patient presents to the ED for concern of right upper quadrant pain, this involves a number of treatment options, and is a complaint that carries with it a high risk of complications and morbidity.  The differential diagnosis includes cholecystitis, choledocho lithiasis, renal colic, msk.    Co morbidities: Discussed in HPI   Brief History:  See HPI.   EMR reviewed including pt PMHx, past surgical history and past visits to ER.  See HPI for more details   Lab Tests:  I ordered and independently interpreted labs.  The pertinent results include:    I personally reviewed all laboratory work and imaging. Metabolic panel without any acute abnormality specifically kidney function within normal limits and no significant electrolyte abnormalities. CBC without leukocytosis or significant anemia.  Imaging Studies:  Right upper quadrant ultrasound without any acute findings. X-ray of his chest without any acute findings.  Cardiac Monitoring:  The patient was maintained on a cardiac monitor.  I personally viewed and interpreted the cardiac monitored which showed an underlying rhythm of: NSR EKG non-ischemic   Medicines ordered:  I ordered medication including Zofran , bolus, morphine  for symptomatic treatment Reevaluation of the patient after these medicines showed that the patient improved I have reviewed the patients home medicines and have made adjustments as needed  Reevaluation:  After the interventions noted above I re-evaluated patient and found that they have :improved  Social Determinants of Health:  The patient's social determinants of health were a factor in the care of this patient  Problem List / ED Course:  Patient presented to the ED with a chief complaint of chest pain, right upper quadrant pain has been ongoing since Monday.  Today he felt like a was hard for him to  breathe with this pain.  He was taken to urgent care, when they did examine him and sent him to the ED for further evaluation.  Labs here today are within normal limits.  His CBC with no leukocytosis, hemoglobin is within normal limits.  BMP with no electrolyte derangement, creatinine levels unremarkable.  Troponin x 2 have been negative.  UA with no nitrates, leukocytes to suggest infection.  Has been feeling improvement in pain after morphine , Zofran , bolus. Concern for gallbladder etiology, therefore ultrasound of his abdomen shown did not show any acute findings to suggest cholecystitis.  I did further workup patient with a CT abdomen and pelvis to rule out any intra-abdominal pathology.  He does report improvement of his pain.   Dispostion:  Patient care signed out to oncoming team pending CT abdomen.  Portions of this note were generated with Scientist, clinical (histocompatibility and immunogenetics). Dictation errors may occur despite best attempts at proofreading.      Final diagnoses:  RUQ pain  Atypical chest pain  Lumbar herniated disc  Constipation, unspecified constipation type  Hepatic steatosis  Aortic atherosclerosis [facility])    ED Discharge Orders          Ordered    polyethylene glycol powder (GLYCOLAX/MIRALAX) 17 GM/SCOOP powder  2 times daily        08/18/23 0027               Courtlynn Holloman, PA-C 08/20/23 2053    Dean Clarity, MD 08/24/23 [phone]

## 2023-08-17 NOTE — ED Triage Notes (Signed)
 Pt reports right sided chest pain and lightheaded. Pt stating the pain is worse when he takes a deep breath.

## 2023-08-17 NOTE — ED Provider Triage Note (Signed)
 Emergency Medicine Provider Triage Evaluation Note  Brandon Boyer , a 53 y.o. male  was evaluated in triage.  Pt complains of chest pain and sHOB.  Review of Systems  Positive: Feeling faint, R flank pain worse with inspiration, nausea Negative: V/D, fever, chills, sore throat,   Physical Exam  BP 117/83 (BP Location: Left Arm)   Pulse 72   Temp 98.1 F (36.7 C) (Oral)   Resp 18   SpO2 100%  Gen:   Awake, no distress   Resp:  Normal effort  MSK:   Moves extremities without difficulty  Other:    Medical Decision Making  Medically screening exam initiated at 1:02 PM.  Appropriate orders placed.  Brandon Boyer was informed that the remainder of the evaluation will be completed by another provider, this initial triage assessment does not replace that evaluation, and the importance of remaining in the ED until their evaluation is complete.  Labs and imaging ordered   Brandon Boyer 08/17/23 1304

## 2023-08-18 MED ORDER — POLYETHYLENE GLYCOL 3350 17 GM/SCOOP PO POWD: 17.0000 g | Freq: Two times a day (BID) | ORAL | 0 refills | Status: AC

## 2023-08-18 NOTE — Discharge Instructions (Signed)
 Your tests and imaging looked good today.  The radiologist does comment that you have large stool burden that could be consistent with constipation.  This might be the cause of your symptoms.  There were several other incidental findings that you will need to discuss with your primary care doctor.  I have listed them under your diagnoses, but these include herniated disc at L4/L5, fatty liver disease, and some atherosclerosis in the aortic and iliac vessels.  Please take medications as prescribed.  Please return for new or worsening symptoms.

## 2023-08-18 NOTE — ED Provider Notes (Signed)
 Patient reassessed.  Pain-free now.  Labs and imaging are reassuring.  Question constipation given CT abdomen/pelvis findings.  Will treat with MiraLAX.  I discussed all of his incidental findings.  He is advised to follow-up with PCP or return for new or worsening symptoms.   Vicky Charleston, PA-C 08/18/23 GLORIANNE Carita Senior, MD 08/18/23 [phone]

## 2023-09-25 ENCOUNTER — Emergency Department (HOSPITAL_COMMUNITY)
Admission: EM | Admit: 2023-09-25 | Discharge: 2023-09-25 | Disposition: A | Discharge status: Home / Self Care | Attending: Emergency Medicine | Admitting: Emergency Medicine

## 2023-09-25 ENCOUNTER — Other Ambulatory Visit: Payer: Self-pay

## 2023-09-25 ENCOUNTER — Emergency Department (HOSPITAL_COMMUNITY)

## 2023-09-25 DIAGNOSIS — R112 Nausea with vomiting, unspecified: Secondary | ICD-10-CM

## 2023-09-25 DIAGNOSIS — Z7984 Long term (current) use of oral hypoglycemic drugs: Secondary | ICD-10-CM | POA: Diagnosis not present

## 2023-09-25 DIAGNOSIS — E119 Type 2 diabetes mellitus without complications: Secondary | ICD-10-CM | POA: Insufficient documentation

## 2023-09-25 DIAGNOSIS — K853 Drug induced acute pancreatitis without necrosis or infection: Secondary | ICD-10-CM | POA: Insufficient documentation

## 2023-09-25 DIAGNOSIS — Z794 Long term (current) use of insulin: Secondary | ICD-10-CM | POA: Diagnosis not present

## 2023-09-25 LAB — CBC
HCT: 47.5 % (ref 39.0–52.0)
Hemoglobin: 16.5 g/dL (ref 13.0–17.0)
MCH: 30.2 pg (ref 26.0–34.0)
MCHC: 34.7 g/dL (ref 30.0–36.0)
MCV: 87 fL (ref 80.0–100.0)
Platelets: 229 K/uL (ref 150–400)
RBC: 5.46 MIL/uL (ref 4.22–5.81)
RDW: 11.5 % (ref 11.5–15.5)
WBC: 9.3 K/uL (ref 4.0–10.5)
nRBC: 0 % (ref 0.0–0.2)

## 2023-09-25 LAB — COMPREHENSIVE METABOLIC PANEL WITH GFR
ALT: 33 U/L (ref 0–44)
AST: 30 U/L (ref 15–41)
Albumin: 4.5 g/dL (ref 3.5–5.0)
Alkaline Phosphatase: 72 U/L (ref 38–126)
Anion gap: 14 (ref 5–15)
BUN: 21 mg/dL — ABNORMAL HIGH (ref 6–20)
CO2: 32 mmol/L (ref 22–32)
Calcium: 9.6 mg/dL (ref 8.9–10.3)
Chloride: 85 mmol/L — ABNORMAL LOW (ref 98–111)
Creatinine, Ser: 1 mg/dL (ref 0.61–1.24)
GFR, Estimated: >60 mL/min (ref >60)
Glucose, Bld: 267 mg/dL — ABNORMAL HIGH (ref 70–99)
Potassium: 3.8 mmol/L (ref 3.5–5.1)
Sodium: 131 mmol/L — ABNORMAL LOW (ref 135–145)
Total Bilirubin: 0.9 mg/dL (ref 0.0–1.2)
Total Protein: 8.3 g/dL — ABNORMAL HIGH (ref 6.5–8.1)

## 2023-09-25 LAB — TROPONIN I (HIGH SENSITIVITY): Troponin I (High Sensitivity): 5 ng/L (ref <18)

## 2023-09-25 LAB — CBG MONITORING, ED: Glucose-Capillary: 292 mg/dL — ABNORMAL HIGH (ref 70–99)

## 2023-09-25 LAB — LIPASE, BLOOD: Lipase: 26 U/L (ref 11–51)

## 2023-09-25 MED ORDER — ONDANSETRON HCL 4 MG PO TABS: 4.0000 mg | ORAL_TABLET | ORAL | 0 refills | Status: AC | PRN

## 2023-09-25 MED ORDER — PROCHLORPERAZINE EDISYLATE 10 MG/2ML IJ SOLN
5.0000 mg | Freq: Once | INTRAMUSCULAR | Status: AC
  Administered 2023-09-25: 5 mg via INTRAVENOUS
  Filled 2023-09-25: qty 2

## 2023-09-25 MED ORDER — DIPHENHYDRAMINE HCL 50 MG/ML IJ SOLN
25.0000 mg | Freq: Once | INTRAMUSCULAR | Status: AC
  Administered 2023-09-25: 25 mg via INTRAVENOUS
  Filled 2023-09-25: qty 1

## 2023-09-25 MED ORDER — ALUM & MAG HYDROXIDE-SIMETH 200-200-20 MG/5ML PO SUSP
30.0000 mL | Freq: Once | ORAL | Status: AC
  Administered 2023-09-25: 30 mL via ORAL
  Filled 2023-09-25: qty 30

## 2023-09-25 MED ORDER — ACETAMINOPHEN 325 MG PO TABS
650.0000 mg | ORAL_TABLET | Freq: Once | ORAL | Status: AC
  Administered 2023-09-25: 650 mg via ORAL
  Filled 2023-09-25: qty 2

## 2023-09-25 MED ORDER — ONDANSETRON HCL 4 MG/2ML IJ SOLN
4.0000 mg | Freq: Once | INTRAMUSCULAR | Status: AC
  Administered 2023-09-25: 4 mg via INTRAVENOUS
  Filled 2023-09-25: qty 2

## 2023-09-25 MED ORDER — IOHEXOL 350 MG/ML SOLN
100.0000 mL | Freq: Once | INTRAVENOUS | Status: AC | PRN
  Administered 2023-09-25: 100 mL via INTRAVENOUS

## 2023-09-25 MED ORDER — LACTATED RINGERS IV BOLUS
1000.0000 mL | Freq: Once | INTRAVENOUS | Status: AC
  Administered 2023-09-25: 1000 mL via INTRAVENOUS

## 2023-09-25 MED ORDER — SUCRALFATE 1 G PO TABS: 1.0000 g | ORAL_TABLET | Freq: Three times a day (TID) | ORAL | 0 refills | Status: AC

## 2023-09-25 MED ORDER — OXYCODONE HCL 5 MG PO TABS: 5.0000 mg | ORAL_TABLET | Freq: Four times a day (QID) | ORAL | 0 refills | Status: AC | PRN

## 2023-09-25 NOTE — ED Notes (Addendum)
Pt refused Covid/Flu/RSV test.

## 2023-09-25 NOTE — ED Provider Notes (Signed)
 [place] EMERGENCY DEPARTMENT AT [facility] Provider Note  CSN: [id number] Arrival date & time: 09/25/23 9261  Chief Complaint(s) Emesis  HPI Brandon Boyer is a 53 y.o. male with past medical history as below, significant for DM2, depression, herniated disd who presents to the ED with complaint of abd pain, nausea vomiting  Patient was started on Mounjaro around a week ago, started after starting Mounjaro he began having abdominal cramping, nausea vomiting.  He had some diarrhea but subsided.  Emesis nonbloody nonbilious.  No fevers.  No recent travel or sick contacts.  No melena or BRBPR.  Abdominal pain and cramping is provoked by p.o. intake.  He has burning from his epigastrium to his throat.  He has never taken this medication    Past Medical History Past Medical History:  Diagnosis Date   Back pain    Depression    Diabetes mellitus without complication (HCC)    Type II   Herniated Disc    There are no active problems to display for this patient.  Home Medication(s) Prior to Admission medications   Medication Sig Start Date End Date Taking? Authorizing Provider  ondansetron  (ZOFRAN ) 4 MG tablet Take 1 tablet (4 mg total) by mouth every 4 (four) hours as needed for nausea or vomiting. 09/25/23  Yes Elnor Jayson LABOR, DO  oxyCODONE  (ROXICODONE ) 5 MG immediate release tablet Take 1 tablet (5 mg total) by mouth every 6 (six) hours as needed for severe pain (pain score 7-10). 09/25/23  Yes Elnor Jayson A, DO  sucralfate  (CARAFATE ) 1 g tablet Take 1 tablet (1 g total) by mouth with breakfast, with lunch, and with evening meal for 7 days. 09/25/23 10/02/23 Yes Elnor Jayson A, DO  cyclobenzaprine  (FLEXERIL ) 5 MG tablet Take 1 tablet (5 mg total) by mouth 2 (two) times daily as needed for muscle spasms. 12/15/21   Donah Penne LABOR, PA-C  insulin  aspart protamine- aspart (NOVOLOG  MIX 70/30) (70-30) 100 UNIT/ML injection Inject 46 Units into the skin 2 (two) times daily with a  meal.    [provider]  lidocaine  (LIDODERM ) 5 % Place 1 patch onto the skin daily as needed. Apply patch to area most significant pain once per day.  Remove and discard patch within 12 hours of application. 10/04/21   Petrucelli, Samantha R, PA-C  metFORMIN (GLUCOPHAGE) 500 MG tablet Take 500 mg by mouth in the morning, at noon, and at bedtime.    [provider]  polyethylene glycol powder (GLYCOLAX /MIRALAX ) 17 GM/SCOOP powder Take 17 g by mouth 2 (two) times daily. Until daily soft stools OTC 08/18/23   Vicky Charleston, PA-C                                                                                                                                    Past Surgical History Past Surgical History:  Procedure Laterality Date   KNEE SURGERY Right 2000  Family History Family History  Problem Relation Age of Onset   Sickle cell anemia Mother    Heart failure Father     Social History Social History   Tobacco Use   Smoking status: Never   Smokeless tobacco: Never  Vaping Use   Vaping status: Never Used  Substance Use Topics   Alcohol use: No   Drug use: No   Allergies Diclofenac and Pork-derived products  Review of Systems A thorough review of systems was obtained and all systems are negative except as noted in the HPI and PMH.   Physical Exam Vital Signs  I have reviewed the triage vital signs BP 138/73 (BP Location: Right Arm)   Pulse (!) 104   Temp 99.9 F (37.7 C) (Oral)   Resp 17   Ht 5' 6 (1.676 m)   Wt 70.3 kg   SpO2 97%   BMI 25.02 kg/m  Physical Exam Vitals and nursing note reviewed.  Constitutional:      General: He is not in acute distress.    Appearance: He is well-developed.  HENT:     Head: Normocephalic and atraumatic.     Right Ear: External ear normal.     Left Ear: External ear normal.     Mouth/Throat:     Mouth: Mucous membranes are moist.  Eyes:     General: No scleral icterus. Cardiovascular:     Rate and Rhythm:  Normal rate and regular rhythm.     Pulses: Normal pulses.     Heart sounds: Normal heart sounds.  Pulmonary:     Effort: Pulmonary effort is normal. No respiratory distress.     Breath sounds: Normal breath sounds.  Abdominal:     General: Abdomen is flat.     Palpations: Abdomen is soft.     Tenderness: There is abdominal tenderness.  Musculoskeletal:     Cervical back: No rigidity.     Right lower leg: No edema.     Left lower leg: No edema.  Skin:    General: Skin is warm and dry.     Capillary Refill: Capillary refill takes less than 2 seconds.  Neurological:     Mental Status: He is alert.  Psychiatric:        Mood and Affect: Mood normal.        Behavior: Behavior normal.     ED Results and Treatments Labs (all labs ordered are listed, but only abnormal results are displayed) Labs Reviewed  COMPREHENSIVE METABOLIC PANEL WITH GFR - Abnormal; Notable for the following components:      Result Value   Sodium 131 (*)    Chloride 85 (*)    Glucose, Bld 267 (*)    BUN 21 (*)    Total Protein 8.3 (*)    All other components within normal limits  CBG MONITORING, ED - Abnormal; Notable for the following components:   Glucose-Capillary 292 (*)    All other components within normal limits  RESP PANEL BY RT-PCR (RSV, FLU A&B, COVID)  RVPGX2  LIPASE, BLOOD  CBC  URINALYSIS, ROUTINE W REFLEX MICROSCOPIC  TROPONIN I (HIGH SENSITIVITY)  Radiology CT Angio Chest PE W and/or Wo Contrast Result Date: 09/25/2023 EXAM: CTA of the Chest with contrast for PE 09/25/2023 03:07:07 PM TECHNIQUE: CTA of the chest was performed after the administration of intravenous contrast. Multiplanar reformatted images are provided for review. MIP images are provided for review. Automated exposure control, iterative reconstruction, and/or weight based adjustment of the mA/kV was  utilized to reduce the radiation dose to as low as reasonably achievable. COMPARISON: 05/13/2022 CLINICAL HISTORY: Pulmonary embolism (PE) suspected, high prob. Epigastric pain; PE suspected; Omnipaque  350; 75 cc; to ED by POV with c/o emesis. Per patient he started Mounjaro five days ago and since then has not been able to keep fluids or solids down, voices ABD cramping and discomfort. FINDINGS: PULMONARY ARTERIES: Pulmonary arteries are adequately opacified for evaluation. No pulmonary embolism. Main pulmonary artery is normal in caliber. MEDIASTINUM: The heart and pericardium demonstrate no acute abnormality. Trace pericardial fluid. There is no acute abnormality of the thoracic aorta. LYMPH NODES: No mediastinal, hilar or axillary lymphadenopathy. LUNGS AND PLEURA: The lungs are without acute process. No focal consolidation or pulmonary edema. No pleural effusion or pneumothorax. UPPER ABDOMEN: Mild peripancreatic inflammatory and edematous change incidentally noted. SOFT TISSUES AND BONES: No acute bone or soft tissue abnormality. IMPRESSION: 1. No pulmonary embolism. 2. Mild peripancreatic inflammatory and edematous change incidentally noted. Electronically signed by: Katheleen Faes MD 09/25/2023 03:25 PM EDT RP Workstation: [id number]   CT ABDOMEN PELVIS W CONTRAST Result Date: 09/25/2023 EXAM: CT ABDOMEN AND PELVIS WITH CONTRAST 09/25/2023 03:07:07 PM TECHNIQUE: CT of the abdomen and pelvis was performed with the administration of intravenous contrast. Multiplanar reformatted images are provided for review. Automated exposure control, iterative reconstruction, and/or weight based adjustment of the mA/kV was utilized to reduce the radiation dose to as low as reasonably achievable. COMPARISON: 08/17/2023 CLINICAL HISTORY: Epigastric pain; PE suspected; Omnipaque  350; 75 cc; to ED by POV with c/o emesis. Per patient he started Mounjaro five days ago and since then has not been able to keep fluids or solids  down, voices ABD cramping and discomfort. FINDINGS: LOWER CHEST: No acute abnormality. LIVER: The liver is unremarkable. GALLBLADDER AND BILE DUCTS: Gallbladder is unremarkable. No biliary ductal dilatation. SPLEEN: No acute abnormality. PANCREAS: Mild edema around the pancreatic head and body, new since previous, without ductal dilatation or focal lesion. ADRENAL GLANDS: No acute abnormality. KIDNEYS, URETERS AND BLADDER: No stones in the kidneys or ureters. No hydronephrosis. No perinephric or periureteral stranding. Urinary bladder is unremarkable. GI AND BOWEL: Stomach demonstrates no acute abnormality. There is no bowel obstruction. No bowel wall thickening. PERITONEUM AND RETROPERITONEUM: No ascites. No free air. VASCULATURE: Aorta is normal in caliber. LYMPH NODES: No lymphadenopathy. REPRODUCTIVE ORGANS: No acute abnormality. BONES AND SOFT TISSUES: No acute osseous abnormality. No focal soft tissue abnormality. IMPRESSION: 1. Mild edema around the pancreatic head and body, suggesting early acute pancreatitis. Electronically signed by: Katheleen Faes MD 09/25/2023 03:22 PM EDT RP Workstation: [id number]    Pertinent labs & imaging results that were available during my care of the patient were reviewed by me and considered in my medical decision making (see MDM for details).  Medications Ordered in ED Medications  lactated ringers  bolus 1,000 mL (0 mLs Intravenous Stopped 09/25/23 0952)  ondansetron  (ZOFRAN ) injection 4 mg (4 mg Intravenous Given 09/25/23 0830)  alum & mag hydroxide-simeth (MAALOX/MYLANTA) 200-200-20 MG/5ML suspension 30 mL (30 mLs Oral Given 09/25/23 0831)  lactated ringers  bolus 1,000 mL (0 mLs Intravenous Stopped 09/25/23 1223)  prochlorperazine  (COMPAZINE ) injection 5 mg (5 mg Intravenous Given 09/25/23 1222)  diphenhydrAMINE  (BENADRYL ) injection 25 mg (25 mg Intravenous Given 09/25/23 1222)  acetaminophen  (TYLENOL ) tablet 650 mg (650 mg Oral Given 09/25/23 1307)  iohexol  (OMNIPAQUE ) 350  MG/ML injection 100 mL (100 mLs Intravenous Contrast Given 09/25/23 1453)                                                                                                                                     Procedures Procedures  (including critical care time)  Medical Decision Making / ED Course    Medical Decision Making:    Cavion Faiola is a 53 y.o. male with past medical history as below, significant for DM2, depression, herniated disd who presents to the ED with complaint of abd pain, nausea vomiting. The complaint involves an extensive differential diagnosis and also carries with it a high risk of complications and morbidity.  Serious etiology was considered. Ddx includes but is not limited to: Differential diagnosis includes but is not exclusive to acute cholecystitis, intrathoracic causes for epigastric abdominal pain, gastritis, duodenitis, pancreatitis, small bowel or large bowel obstruction, abdominal aortic aneurysm, hernia, gastritis, etc.   Complete initial physical exam performed, notably the patient was in no acute distress.    Reviewed and confirmed nursing documentation for past medical history, family history, social history.  Vital signs reviewed.    Abdominal pain Nausea vomiting Medication side effect> - Abdominal pain nausea vomiting since starting Mounjaro, no recent travel, no sick contacts, no fever, normal state of health prior to starting medication - labs have resulted, these are stable.  He is feeling much better, tolerant p.o. w/o difficulty.  CT concerning for mild early pancreatitis, this is likely secondary to his Mounjaro.  Advised to discontinue Mounjaro.  Bland diet, follow-up PCP. - tachycardia improved, sinus tachy > nsr    Clinical Course as of 09/25/23 1549  Mon Sep 25, 2023  1146 Feeling better, tolerating po [SG]    Clinical Course User Index [SG] Elnor Savant A, DO     Patient in no distress and overall condition is stable. Detailed  discussions were had with the patient/guardian regarding current findings, and need for close f/u with PCP or on call doctor. The patient/guardian has been instructed to return immediately if the symptoms worsen in any way for re-evaluation. Patient/guardian verbalized understanding and is in agreement with current care plan. All questions answered prior to discharge.             Additional history obtained: -Additional history obtained from spouse -External records from outside source obtained and reviewed including: Chart review including previous notes, labs, imaging, consultation notes including  Prior ER evaluation, primary care documentation   Lab Tests: -I ordered, reviewed, and interpreted labs.   The pertinent results include:   Labs Reviewed  COMPREHENSIVE METABOLIC PANEL WITH GFR - Abnormal; Notable for the following components:  Result Value   Sodium 131 (*)    Chloride 85 (*)    Glucose, Bld 267 (*)    BUN 21 (*)    Total Protein 8.3 (*)    All other components within normal limits  CBG MONITORING, ED - Abnormal; Notable for the following components:   Glucose-Capillary 292 (*)    All other components within normal limits  RESP PANEL BY RT-PCR (RSV, FLU A&B, COVID)  RVPGX2  LIPASE, BLOOD  CBC  URINALYSIS, ROUTINE W REFLEX MICROSCOPIC  TROPONIN I (HIGH SENSITIVITY)    Notable for mild hyponatremia  EKG   EKG Interpretation Date/Time:    Ventricular Rate:    PR Interval:    QRS Duration:    QT Interval:    QTC Calculation:   R Axis:      Text Interpretation:           Imaging Studies ordered: I ordered imaging studies including ctap ctpe I independently visualized the following imaging with scope of interpretation limited to determining acute life threatening conditions related to emergency care; findings noted above I agree with the radiologist interpretation If any imaging was obtained with contrast I closely monitored patient for any  possible adverse reaction a/w contrast administration in the emergency department   Medicines ordered and prescription drug management: Meds ordered this encounter  Medications   lactated ringers  bolus 1,000 mL   ondansetron  (ZOFRAN ) injection 4 mg   alum & mag hydroxide-simeth (MAALOX/MYLANTA) 200-200-20 MG/5ML suspension 30 mL   lactated ringers  bolus 1,000 mL   prochlorperazine  (COMPAZINE ) injection 5 mg   diphenhydrAMINE  (BENADRYL ) injection 25 mg   acetaminophen  (TYLENOL ) tablet 650 mg   iohexol  (OMNIPAQUE ) 350 MG/ML injection 100 mL   ondansetron  (ZOFRAN ) 4 MG tablet    Sig: Take 1 tablet (4 mg total) by mouth every 4 (four) hours as needed for nausea or vomiting.    Dispense:  12 tablet    Refill:  0   sucralfate  (CARAFATE ) 1 g tablet    Sig: Take 1 tablet (1 g total) by mouth with breakfast, with lunch, and with evening meal for 7 days.    Dispense:  21 tablet    Refill:  0   oxyCODONE  (ROXICODONE ) 5 MG immediate release tablet    Sig: Take 1 tablet (5 mg total) by mouth every 6 (six) hours as needed for severe pain (pain score 7-10).    Dispense:  10 tablet    Refill:  0    -I have reviewed the patients home medicines and have made adjustments as needed   Consultations Obtained: na   Cardiac Monitoring: The patient was maintained on a cardiac monitor.  I personally viewed and interpreted the cardiac monitored which showed an underlying rhythm of: sinus tachy >NSR Continuous pulse oximetry interpreted by myself, 98% on RA.    Social Determinants of Health:  Diagnosis or treatment significantly limited by social determinants of health: former etoh    Reevaluation: After the interventions noted above, I reevaluated the patient and found that they have improved  Co morbidities that complicate the patient evaluation  Past Medical History:  Diagnosis Date   Back pain    Depression    Diabetes mellitus without complication (HCC)    Type II   Herniated Disc        Dispostion: Disposition decision including need for hospitalization was considered, and patient discharged from emergency department.    Final Clinical Impression(s) / ED Diagnoses Final diagnoses:  Drug-induced  acute pancreatitis, unspecified complication status  Nausea and vomiting, unspecified vomiting type        Elnor Jayson LABOR, DO 09/25/23 1549

## 2023-09-25 NOTE — ED Triage Notes (Signed)
 Patient to ED by POV with c/o emesis. Per patient he started Mounjaro five days ago and since then has not been able to keep fluids or solids down, voices ABD cramping and discomfort.

## 2023-09-25 NOTE — Discharge Instructions (Addendum)
 It was a pleasure caring for you today in the emergency department.  Recommend you drink plenty fluids, eat a bland diet, discontinue mounjaro.  Please follow-up with your PCP to discuss medication alternatives  Please return to the emergency department for any worsening or worrisome symptoms.
